# Patient Record
Sex: Male | Born: 1960 | Race: White | Hispanic: No | Marital: Single | Smoking: Former smoker
Health system: Southern US, Community
[De-identification: ages and names within clinical notes are randomized; demographics above are authoritative.]

## PROBLEM LIST (undated history)

## (undated) DIAGNOSIS — G4700 Insomnia, unspecified: Secondary | ICD-10-CM

## (undated) DIAGNOSIS — R188 Other ascites: Secondary | ICD-10-CM

## (undated) DIAGNOSIS — Z8711 Personal history of peptic ulcer disease: Secondary | ICD-10-CM

## (undated) DIAGNOSIS — F1020 Alcohol dependence, uncomplicated: Secondary | ICD-10-CM

## (undated) DIAGNOSIS — Z8719 Personal history of other diseases of the digestive system: Secondary | ICD-10-CM

## (undated) DIAGNOSIS — K7460 Unspecified cirrhosis of liver: Secondary | ICD-10-CM

## (undated) DIAGNOSIS — F419 Anxiety disorder, unspecified: Secondary | ICD-10-CM

## (undated) HISTORY — PX: HERNIA REPAIR: SHX51

## (undated) HISTORY — DX: Personal history of other diseases of the digestive system: Z87.19

## (undated) HISTORY — DX: Personal history of peptic ulcer disease: Z87.11

## (undated) HISTORY — PX: APPENDECTOMY: SHX54

## (undated) HISTORY — DX: Alcohol dependence, uncomplicated: F10.20

## (undated) HISTORY — DX: Anxiety disorder, unspecified: F41.9

---

## 2011-09-25 ENCOUNTER — Encounter (HOSPITAL_COMMUNITY): Payer: Self-pay | Admitting: *Deleted

## 2011-09-25 ENCOUNTER — Emergency Department (HOSPITAL_COMMUNITY)
Admission: EM | Admit: 2011-09-25 | Discharge: 2011-09-25 | Disposition: A | Discharge status: Home / Self Care | Payer: Medicaid Other | Attending: Emergency Medicine | Admitting: Emergency Medicine

## 2011-09-25 ENCOUNTER — Emergency Department (HOSPITAL_COMMUNITY): Payer: Medicaid Other

## 2011-09-25 DIAGNOSIS — R141 Gas pain: Secondary | ICD-10-CM | POA: Insufficient documentation

## 2011-09-25 DIAGNOSIS — K7460 Unspecified cirrhosis of liver: Secondary | ICD-10-CM

## 2011-09-25 DIAGNOSIS — Z79899 Other long term (current) drug therapy: Secondary | ICD-10-CM | POA: Insufficient documentation

## 2011-09-25 DIAGNOSIS — R143 Flatulence: Secondary | ICD-10-CM | POA: Insufficient documentation

## 2011-09-25 DIAGNOSIS — R142 Eructation: Secondary | ICD-10-CM | POA: Insufficient documentation

## 2011-09-25 DIAGNOSIS — R188 Other ascites: Secondary | ICD-10-CM | POA: Insufficient documentation

## 2011-09-25 DIAGNOSIS — R0602 Shortness of breath: Secondary | ICD-10-CM | POA: Insufficient documentation

## 2011-09-25 HISTORY — DX: Other ascites: R18.8

## 2011-09-25 HISTORY — DX: Unspecified cirrhosis of liver: K74.60

## 2011-09-25 LAB — COMPREHENSIVE METABOLIC PANEL
AST: 41 U/L — ABNORMAL HIGH (ref 0–37)
CO2: 26 mEq/L (ref 19–32)
Calcium: 8.7 mg/dL (ref 8.4–10.5)
Creatinine, Ser: 0.51 mg/dL (ref 0.50–1.35)
GFR calc Af Amer: >90 mL/min (ref >90)
GFR calc non Af Amer: >90 mL/min (ref >90)
Sodium: 133 mEq/L — ABNORMAL LOW (ref 135–145)
Total Protein: 7.6 g/dL (ref 6.0–8.3)

## 2011-09-25 LAB — CBC
HCT: 32.3 % — ABNORMAL LOW (ref 39.0–52.0)
Hemoglobin: 10.3 g/dL — ABNORMAL LOW (ref 13.0–17.0)
MCH: 25.3 pg — ABNORMAL LOW (ref 26.0–34.0)
MCHC: 31.9 g/dL (ref 30.0–36.0)
MCV: 79.4 fL (ref 78.0–100.0)
Platelets: DECREASED 10*3/uL (ref 150–400)
RBC: 4.07 MIL/uL — ABNORMAL LOW (ref 4.22–5.81)
RDW: 18.1 % — ABNORMAL HIGH (ref 11.5–15.5)
WBC: 5 10*3/uL (ref 4.0–10.5)

## 2011-09-25 LAB — COMPREHENSIVE METABOLIC PANEL WITH GFR
ALT: 22 U/L (ref 0–53)
Albumin: 2.5 g/dL — ABNORMAL LOW (ref 3.5–5.2)
Alkaline Phosphatase: 92 U/L (ref 39–117)
BUN: 11 mg/dL (ref 6–23)
Chloride: 98 meq/L (ref 96–112)
Glucose, Bld: 106 mg/dL — ABNORMAL HIGH (ref 70–99)
Potassium: 3.7 meq/L (ref 3.5–5.1)
Total Bilirubin: 0.8 mg/dL (ref 0.3–1.2)

## 2011-09-25 LAB — PROTIME-INR
INR: 1.58 — ABNORMAL HIGH (ref 0.00–1.49)
Prothrombin Time: 19.2 seconds — ABNORMAL HIGH (ref 11.6–15.2)

## 2011-09-25 LAB — APTT: aPTT: 36 s (ref 24–37)

## 2011-09-25 LAB — PLATELET COUNT: Platelets: 112 10*3/uL — ABNORMAL LOW (ref 150–400)

## 2011-09-25 MED ORDER — FUROSEMIDE 80 MG PO TABS: 80.0000 mg | ORAL_TABLET | Freq: Two times a day (BID) | ORAL | Status: DC

## 2011-09-25 MED ORDER — SPIRONOLACTONE 100 MG PO TABS: 100.0000 mg | ORAL_TABLET | Freq: Four times a day (QID) | ORAL | Status: DC

## 2011-09-25 NOTE — ED Notes (Signed)
Patient has been out of his meds for 2 mths.  He does not have a local md,  He moved here recently.  Patient has large abd swelling.  States he needs paracentesis and rx for his meds.

## 2011-09-25 NOTE — Discharge Instructions (Signed)
 Ascites Ascites is a gathering of fluid in the belly (abdomen). This is most often caused by liver disease. It may also be caused by a number of other less common problems. It causes a ballooning out (distension) of the abdomen. CAUSES  Scarring of the liver (cirrhosis) is the most common cause of ascites. Other causes include:  Infection or inflammation in the abdomen.   Cancer in the abdomen.   Heart failure.   Certain forms of kidney failure (nephritic syndrome).   Inflammation of the pancreas.   Clots in the veins of the liver.  SYMPTOMS  In the early stages of ascites, you may not have any symptoms. The main symptom of ascites is a sense of abdominal bloating. This is due to the presence of fluid. This may also cause an increase in abdominal or waist size. People with this condition can develop swelling in the legs, and men can develop a swollen scrotum. When there is a lot of fluid, it may be hard to breath. Stretching of the abdomen by fluid can be painful. DIAGNOSIS  Certain features of your medical history, such as a history of liver disease and of an enlarging abdomen, can suggest the presence of ascites. The diagnosis of ascites can be made on physical exam by your caregiver. An abdominal ultrasound examination can confirm that ascites is present, and estimate the amount of fluid. Once ascites is confirmed, it is important to determine its cause. Again, a history of one of the conditions listed in CAUSES provides a strong clue. A physical exam is important, and blood and x-ray tests may be needed. During a procedure called paracentesis, a sample of fluid is removed from the abdomen. This can determine certain key features about the fluid, such as whether or not infection or cancer is present. Your caregiver will determine if a paracentesis is necessary. They will describe the procedure to you. PREVENTION  Ascites is a complication of other conditions. Therefore to prevent ascites,  you must seek treatment for any significant health conditions you have. Once ascites is present, careful attention to fluid and salt intake may help prevent it from getting worse. If you have ascites, you should not drink alcohol. PROGNOSIS  The prognosis of ascites depends on the underlying disease. If the disease is reversible, such as with certain infections or with heart failure, then ascites may improve or disappear. When ascites is caused by cirrhosis, then it indicates that the liver disease has worsened, and further evaluation and treatment of the liver disease is needed. If your ascites is caused by cancer, then the success or failure of the cancer treatment will determine whether your ascites will improve or worsen. RISKS AND COMPLICATIONS  Ascites is likely to worsen if it is not properly diagnosed and treated. A large amount of ascites can cause pain and difficulty breathing. The main complication, besides worsening, is infection (called spontaneous bacterial peritonitis). This requires prompt treatment. TREATMENT  The treatment of ascites depends on its cause. When liver disease is your cause, medical management using water pills (diuretics) and decreasing salt intake is often effective. Ascites due to peritoneal inflammation or malignancy (cancer) alone does not respond to salt restriction and diuretics. Hospitalization is sometimes required. If the treatment of ascites cannot be managed with medications, a number of other treatments are available. Your caregivers will help you decide which will work best for you. Some of these are:  Removal of fluid from the abdomen (paracentesis).   Fluid from the abdomen  is passed into a vein (peritoneo-venous shunting).   Liver transplantation.   Transjugular intrahepatic portosystemic stent shunt.  HOME CARE INSTRUCTIONS  It is important to monitor body weight and the intake and output of fluids. Weigh yourself at the same time every day. Record  your weights. Fluid restriction may be necessary. It is also important to know your salt intake. The more salt you take in, the more fluid you will retain. Ninety percent of people with ascites respond to this approach.  Follow any directions for medications carefully.   Follow-up with your caregiver, as directed.   Be sure to report any changes in your health, especially any new or worsening symptoms.   If your ascites is from liver disease, you should avoid alcohol and other substances toxic to the liver.  SEEK MEDICAL CARE IF:   Your weight increases more than a few pounds in a few days.   You abdominal or waist size increases.   You develop swelling in your legs.   If you had swelling and it worsens.  SEEK IMMEDIATE MEDICAL CARE IF:   You develop a fever.   You develop new abdominal pain.   You develop difficulty breathing.   You develop confusion.   You have bleeding from the mouth, stomach, or rectum.  MAKE SURE YOU:   Understand these instructions.   Will watch your condition.   Will get help right away if you are not doing well or get worse.  Document Released: 08/13/2005 Document Revised: 04/25/2011 Document Reviewed: 03/14/2007 [facility] Patient Information 2012 [place], [place].     Please start your medications taking the furosemide  once daily for 3 days before going up to twice daily.  And the spironolactone  once daily for 3 days, then twice daily for 3 days and after that, finally go up to 4 times daily.  This is to ensure that your body becomes accustomed to the fluid changes.

## 2011-09-25 NOTE — ED Provider Notes (Signed)
51 year old gentleman with history of cirrhosis the liver who recently undergoing a paracentesis. He is currently being monitored in CDU to assure his vital sign will be at baseline prior to discharge.  6:38 PM Patient appears to be in no acute distress after paracentesis. Dr. Oletta Lamas has re-evaluated the patient and will be discharging him.  Fayrene Helper, PA-C 09/25/11 1839

## 2011-09-25 NOTE — ED Provider Notes (Signed)
Medical screening examination/treatment/procedure(s) were conducted as a shared visit with non-physician practitioner(s) and myself.  I personally evaluated the patient during the encounter Please see my full H&P. Pt is ambulating, reports feeling much improved, vitals are stable.  Referrals provided as well as outpt prescriptions  Gavin Pound. Aliyanna Wassmer, MD 09/25/11 1958

## 2011-09-25 NOTE — ED Notes (Signed)
Pt transported to have US paracentesis. Pt to be moved to CDU 4, then to be discharged home after procedure.

## 2011-09-25 NOTE — ED Notes (Signed)
Pt presents to department for evaluation of abdominal swelling and discomfort. History of cirrhosis. Pt states he ran out of diuretic medication several weeks ago. Abdomen hard and distended. Tender to palpation. Bowel sounds present all quadrants. No nausea/vomiting. 5/10 pain at the time. Ambulatory without difficulty. Pt is alert and oriented x4. No signs of distress at the time.

## 2011-09-25 NOTE — ED Provider Notes (Signed)
History     CSN: [id number]  Arrival date & time 09/25/11  1058   First MD Initiated Contact with Patient 09/25/11 1437      Chief Complaint  Patient presents with  . Edema  . 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 He used to live in [place] but moved here to [place] 6 days ago. He reports that his sister lives here in this town therefore he would be closer to family. The patient does not have insurance and is in the process of trying to establish with Medicaid. He [facility] not [facility]. He denies any other health problems. He reports that he was taking 160 mg of furosemide as well as 400 mg of spironolactone daily. He is been out of his diuretic medication for the last 2 months since he lost his insurance and his primary care physician would no longer see him or refill his medications. The patient did have hernia surgery as well as paracentesis about 6 months ago. This was the last time he had a surgical drainage. He reports it being on the diuretics kept him from developing significant swelling. He reports currently his abdomen is very swollen and he feels like approximately 10 L of fluid might be able to be drained. Patient has felt the swollen in the past. He reports that he is short of breath because he simply cannot take a deep breath. He denies any chest pain, fever, congestion, cough. He reports that he has never been told of any platelet or coagulopathies in the past. He is here essentially asking if he can have a paracentesis procedure and whether or not I would be willing to give him refills of his medications until his Medicaid might be able to kick in and he can establish with a primary care physician.  Patient  is a 51 y.o. male presenting with shortness of breath. The history is provided by the patient.  Shortness of Breath  Associated symptoms include shortness of breath. Pertinent negatives include no chest pain and no fever.    Past Medical History  Diagnosis Date  . Cirrhosis of liver   . Ascitic fluid     Past Surgical History  Procedure Date  . Hernia repair   . Appendectomy     No family history on file.  History  Substance Use Topics  . Smoking status: Current Everyday Smoker  . Smokeless tobacco: Not on file  . Alcohol Use: No      Review of Systems  Constitutional: Negative for fever and chills.  Respiratory: Positive for shortness of breath.   Cardiovascular: Negative for chest pain.  Gastrointestinal: Positive for abdominal distention. Negative for nausea, vomiting and diarrhea.  Hematological: Does not bruise/bleed easily.  Psychiatric/Behavioral: Negative for confusion.  All other systems reviewed and are negative.    Allergies  Review of patient's allergies indicates no known allergies.  Home Medications   Current Outpatient Rx  Name Route Sig Dispense Refill  . FUROSEMIDE PO Oral Take 1 tablet by mouth daily.    . FUROSEMIDE 80 MG PO TABS Oral Take 1 tablet (80 mg total) by mouth 2 (two) times daily. 60 tablet 0  . SPIRONOLACTONE 100 MG PO TABS Oral Take 1 tablet (100 mg total) by mouth 4 (four) times daily. 120  tablet 0    BP 112/74  Pulse 98  Temp(Src) 98 F (36.7 C) (Oral)  Resp 18  Ht 5\' 9"  (1.753 m)  Wt 185 lb (83.915 kg)  BMI 27.32 kg/m2  SpO2 100%  Physical Exam  Nursing note and vitals reviewed. Constitutional: He is oriented to person, place, and time. He appears well-developed.  HENT:  Head: Normocephalic.  Eyes: Pupils are equal, round, and reactive to light.  Cardiovascular: Normal rate.   Pulmonary/Chest: Effort normal. He has no wheezes. He has no rales.  Abdominal: He exhibits distension. There is no rebound and no  guarding.  Neurological: He is alert and oriented to person, place, and time.  Skin: Skin is warm. No rash noted.  Psychiatric: He has a normal mood and affect.    ED Course  Procedures (including critical care time)  Labs Reviewed  CBC - Abnormal; Notable for the following:    RBC 4.07 (*)    Hemoglobin 10.3 (*)    HCT 32.3 (*)    MCH 25.3 (*)    RDW 18.1 (*)    All other components within normal limits  COMPREHENSIVE METABOLIC PANEL - Abnormal; Notable for the following:    Sodium 133 (*)    Glucose, Bld 106 (*)    Albumin 2.5 (*)    AST 41 (*)    All other components within normal limits  PROTIME-INR - Abnormal; Notable for the following:    Prothrombin Time 19.2 (*)    INR 1.58 (*)    All other components within normal limits  APTT  PLATELET COUNT   No results found.   1. Ascites   2. Cirrhosis       MDM  Patient has a extremely protuberant and distended abdomen with noticeable spider veins. This is likely secondary to cirrhosis and significant ascites. My plan is to obtain coags and platelet count and consider paracentesis with interventional radiology. I have discussed the need for establishing with a primary care clinic such as [facility] even though Medicaid has not yet taken. I do not mind giving him a prescription for furosemide spironolactone, however will need some baseline blood test to begin with. Also he is asking for 3 months of medication which I am not comfortable giving. He may have to return to the emergency department for recheck if he is not able to establish with [facility] in the meantime. His room air saturations are 100% which is normal. Patient does not have any chest pain in his lung sounds are clear. I suspect his ascites is the reason for his subjective dyspnea.   3:54 PM  I spoke into the interventional radiologist who will perform ultrasound-guided paracentesis. I have written for one month supply of both furosemide and  spironolactone for the patient at his usual dose. However I advised him to go slowly and to take half doses for about 3 days before moving on to his original home dose since he has not been on his medications for approximately 2 months. I have also given him a referral to [facility] for him to establish with a primary care physician.         Gavin Pound. Briar Witherspoon, MD 09/25/11 1555

## 2011-09-25 NOTE — ED Notes (Signed)
Pt returned from ultrasound. Denies complaints. To be d/c'd.

## 2011-09-27 ENCOUNTER — Telehealth (HOSPITAL_COMMUNITY): Payer: Self-pay

## 2011-10-17 ENCOUNTER — Encounter (HOSPITAL_COMMUNITY): Payer: Self-pay | Admitting: *Deleted

## 2011-10-17 ENCOUNTER — Emergency Department (HOSPITAL_COMMUNITY)
Admission: EM | Admit: 2011-10-17 | Discharge: 2011-10-17 | Disposition: A | Discharge status: Home Health | Payer: Medicaid Other | Attending: Emergency Medicine | Admitting: Emergency Medicine

## 2011-10-17 DIAGNOSIS — Z79899 Other long term (current) drug therapy: Secondary | ICD-10-CM | POA: Insufficient documentation

## 2011-10-17 DIAGNOSIS — K7460 Unspecified cirrhosis of liver: Secondary | ICD-10-CM | POA: Insufficient documentation

## 2011-10-17 DIAGNOSIS — E871 Hypo-osmolality and hyponatremia: Secondary | ICD-10-CM | POA: Insufficient documentation

## 2011-10-17 LAB — PROTIME-INR: Prothrombin Time: 17.2 seconds — ABNORMAL HIGH (ref 11.6–15.2)

## 2011-10-17 LAB — BASIC METABOLIC PANEL
BUN: 21 mg/dL (ref 6–23)
Creatinine, Ser: 0.73 mg/dL (ref 0.50–1.35)
GFR calc Af Amer: >90 mL/min (ref >90)
GFR calc non Af Amer: >90 mL/min (ref >90)

## 2011-10-17 LAB — CBC
MCH: 25.2 pg — ABNORMAL LOW (ref 26.0–34.0)
MCHC: 33.2 g/dL (ref 30.0–36.0)
MCV: 76 fL — ABNORMAL LOW (ref 78.0–100.0)
Platelets: 85 10*3/uL — ABNORMAL LOW (ref 150–400)
RDW: 18.9 % — ABNORMAL HIGH (ref 11.5–15.5)

## 2011-10-17 MED ORDER — FUROSEMIDE 80 MG PO TABS: 80.0000 mg | ORAL_TABLET | Freq: Two times a day (BID) | ORAL | Status: DC

## 2011-10-17 MED ORDER — POTASSIUM CHLORIDE CRYS ER 20 MEQ PO TBCR: 20.0000 meq | EXTENDED_RELEASE_TABLET | Freq: Two times a day (BID) | ORAL | Status: DC

## 2011-10-17 MED ORDER — SPIRONOLACTONE 100 MG PO TABS: 100.0000 mg | ORAL_TABLET | Freq: Four times a day (QID) | ORAL | Status: DC

## 2011-10-17 MED ORDER — SODIUM CHLORIDE 0.9 % IV SOLN: Freq: Once | INTRAVENOUS | Status: DC

## 2011-10-17 NOTE — ED Notes (Signed)
Patient left ambulatory. Verbalized understanding that follow up required for refill of prescriptions.

## 2011-10-17 NOTE — ED Provider Notes (Signed)
I saw and evaluated the patient, reviewed the resident's note and I agree with the findings and plan. 51 year old, male, with cirrhosis, presents to the emergency department requesting a refill of his medications.  He has no pain, nausea, vomiting, fevers, chills, or shortness of breath.  He is waiting to get enrolled in [place], so, he can see a gastroenterologist.  He notices dosages of both parietal act and Lasix, which we will refill.  Since both are diuretics.  We will do a chemistry test, but no other tests are indicated.  His physical examination is completely benign.  Nicholes Stairs, MD 10/17/11 [phone]

## 2011-10-17 NOTE — Discharge Instructions (Signed)
Hyponatremia  Hyponatremia is when the amount of salt (sodium) in your blood is too low. When sodium levels are low, your cells will absorb extra water and swell. The swelling happens throughout the body, but it mostly affects the brain. Severe brain swelling (cerebral edema), seizures, or coma can happen.  CAUSES   Heart, kidney, or liver problems.   Thyroid problems.   Adrenal gland problems.   Severe vomiting and diarrhea.   Certain medicines or illegal drugs.   Dehydration.   Drinking too much water.   Low-sodium diet.  SYMPTOMS   Nausea and vomiting.   Confusion.   Lethargy.   Agitation.   Headache.   Twitching or shaking (seizures).   Unconsciousness.   Appetite loss.   Muscle weakness and cramping.  DIAGNOSIS  Hyponatremia is identified by a simple blood test. Your caregiver will perform a history and physical exam to try to find the cause and type of hyponatremia. Other tests may be needed to measure the amount of sodium in your blood and urine. TREATMENT  Treatment will depend on the cause.   Fluids may be given through the vein (IV).   Medicines may be used to correct the sodium imbalance. If medicines are causing the problem, they will need to be adjusted.   Water or fluid intake may be restricted to restore proper balance.  The speed of correcting the sodium problem is very important. If the problem is corrected too fast, nerve damage (sometimes unchangeable) can happen. HOME CARE INSTRUCTIONS   Only take medicines as directed by your caregiver. Many medicines can make hyponatremia worse. Discuss all your medicines with your caregiver.   Carefully follow any recommended diet, including any fluid restrictions.   You may be asked to repeat lab tests. Follow these directions.   Avoid alcohol and recreational drugs.  SEEK MEDICAL CARE IF:   You develop worsening nausea, fatigue, headache, confusion, or weakness.   Your original hyponatremia  symptoms return.   You have problems following the recommended diet.  SEEK IMMEDIATE MEDICAL CARE IF:   You have a seizure.   You faint.   You have ongoing diarrhea or vomiting.  MAKE SURE YOU:   Understand these instructions.   Will watch your condition.   Will get help right away if you are not doing well or get worse.  Document Released: 08/03/2002 Document Revised: 04/25/2011 Document Reviewed: 01/28/2011 [employer] Patient Information 2012 [employer]. 

## 2011-10-17 NOTE — ED Provider Notes (Signed)
History     CSN: [id number]  Arrival date & time 10/17/11  0724   First MD Initiated Contact with Patient 10/17/11 0732      Chief Complaint  Patient presents with  . Medication Refill    HPI History provided by the patient.  States he has been doing well since his ED visit on 09/25/11 when he had a large volume paracentesis and was prescribed a month's worth of Lasix and spronolactone.  He states he is waiting on Medicaid, but does not know how long that will take.  He says he needs refills on his medications or his belly will swell up again.   He denies any abdominal pain, fevers, shortness of breath.   Past Medical History  Diagnosis Date  . Cirrhosis of liver   . Ascitic fluid     Past Surgical History  Procedure Date  . Hernia repair   . Appendectomy     History reviewed. No pertinent family history.  History  Substance Use Topics  . Smoking status: Current Everyday Smoker -- 0.5 packs/day  . Smokeless tobacco: Not on file  . Alcohol Use: No      Review of Systems  Constitutional: Negative for fever.  HENT: Negative for rhinorrhea.   Eyes: Negative for visual disturbance.  Respiratory: Negative for shortness of breath.   Cardiovascular: Negative for chest pain.  Gastrointestinal: Negative for abdominal pain and abdominal distention.  Genitourinary: Negative for dysuria.  Musculoskeletal: Negative for back pain.  Skin: Negative for rash.  Neurological: Negative for weakness.  Hematological: Does not bruise/bleed easily.    Allergies  Review of patient's allergies indicates no known allergies.  Home Medications   Current Outpatient Rx  Name Route Sig Dispense Refill  . FUROSEMIDE 80 MG PO TABS Oral Take 1 tablet (80 mg total) by mouth 2 (two) times daily. 60 tablet 0  . OMEPRAZOLE MAGNESIUM 20.6 (20 BASE) MG PO CPDR Oral Take 1 capsule by mouth daily.    Marland Kitchen POTASSIUM CHLORIDE 20 MEQ PO PACK Oral Take 20 mEq by mouth 2 (two) times daily.    Marland Kitchen  SPIRONOLACTONE 100 MG PO TABS Oral Take 1 tablet (100 mg total) by mouth 4 (four) times daily. 120 tablet 0    BP 105/61  Pulse 98  Temp(Src) 98.2 F (36.8 C) (Oral)  Resp 16  SpO2 100%  Physical Exam  Constitutional: He is oriented to person, place, and time. No distress.  HENT:  Head: Normocephalic and atraumatic.  Mouth/Throat: Oropharynx is clear and moist.  Eyes: EOM are normal. Pupils are equal, round, and reactive to light.  Neck: Normal range of motion. No JVD present.  Cardiovascular: Normal rate, regular rhythm, normal heart sounds and intact distal pulses.   Pulmonary/Chest: Effort normal and breath sounds normal. He has no rales.  Abdominal:       Soft, + BS, no tenderness, with slight ascites.   Musculoskeletal: He exhibits no edema.  Neurological: He is alert and oriented to person, place, and time.  Skin: Skin is warm and dry. No rash noted.    ED Course  Procedures (including critical care time)  Labs Reviewed  BASIC METABOLIC PANEL - Abnormal; Notable for the following:    Sodium 125 (*)    Chloride 94 (*)    Glucose, Bld 107 (*)    All other components within normal limits  PROTIME-INR - Abnormal; Notable for the following:    Prothrombin Time 17.2 (*)    All other  components within normal limits  CBC   No results found.   1. Cirrhosis   2. Hyponatremia     MDM  Patient advised he should be admitted due to hyponatremia and risk for seizures and death.  Patient refuses admission and states he has a 51 year old child to take care of, cannot be admitted.  He voices understanding about his condition.  He states he will increase his salt intake.  Prescriptions refilled and contact information about [facility] given, pt advised to call for an appointment.         Ardyth Gal, MD 10/18/11 1432

## 2011-10-17 NOTE — ED Notes (Signed)
Patient came in due to needing a prescription refill and wants his potassium checked.  Patient does not have a MD and is awaiting insurance.  Had ascites last month with paracentesis on 09/25/11

## 2011-10-18 NOTE — ED Provider Notes (Signed)
I saw and evaluated the patient, reviewed the resident's note and I agree with the findings and plan.  Nicholes Stairs, MD 10/18/11 [phone]

## 2011-11-26 ENCOUNTER — Inpatient Hospital Stay (HOSPITAL_COMMUNITY)
Admission: EM | Admit: 2011-11-26 | Discharge: 2011-11-27 | DRG: 378 | Discharge status: Against Medical Advice | Payer: Medicaid Other | Attending: Pulmonary Disease | Admitting: Pulmonary Disease

## 2011-11-26 ENCOUNTER — Emergency Department (HOSPITAL_COMMUNITY): Payer: Medicaid Other

## 2011-11-26 ENCOUNTER — Encounter (HOSPITAL_COMMUNITY): Payer: Self-pay

## 2011-11-26 DIAGNOSIS — D62 Acute posthemorrhagic anemia: Secondary | ICD-10-CM | POA: Diagnosis present

## 2011-11-26 DIAGNOSIS — R0902 Hypoxemia: Secondary | ICD-10-CM | POA: Diagnosis present

## 2011-11-26 DIAGNOSIS — K7030 Alcoholic cirrhosis of liver without ascites: Secondary | ICD-10-CM | POA: Diagnosis present

## 2011-11-26 DIAGNOSIS — R188 Other ascites: Secondary | ICD-10-CM | POA: Diagnosis present

## 2011-11-26 DIAGNOSIS — R579 Shock, unspecified: Secondary | ICD-10-CM

## 2011-11-26 DIAGNOSIS — K769 Liver disease, unspecified: Secondary | ICD-10-CM | POA: Diagnosis present

## 2011-11-26 DIAGNOSIS — K7290 Hepatic failure, unspecified without coma: Secondary | ICD-10-CM

## 2011-11-26 DIAGNOSIS — F17200 Nicotine dependence, unspecified, uncomplicated: Secondary | ICD-10-CM | POA: Diagnosis present

## 2011-11-26 DIAGNOSIS — D689 Coagulation defect, unspecified: Secondary | ICD-10-CM | POA: Diagnosis present

## 2011-11-26 DIAGNOSIS — F1020 Alcohol dependence, uncomplicated: Secondary | ICD-10-CM | POA: Diagnosis present

## 2011-11-26 DIAGNOSIS — J811 Chronic pulmonary edema: Secondary | ICD-10-CM | POA: Diagnosis present

## 2011-11-26 DIAGNOSIS — K920 Hematemesis: Principal | ICD-10-CM | POA: Diagnosis present

## 2011-11-26 LAB — COMPREHENSIVE METABOLIC PANEL
AST: 34 U/L (ref 0–37)
Albumin: 1.6 g/dL — ABNORMAL LOW (ref 3.5–5.2)
CO2: 15 mEq/L — ABNORMAL LOW (ref 19–32)
Calcium: 6.8 mg/dL — ABNORMAL LOW (ref 8.4–10.5)
Creatinine, Ser: 0.72 mg/dL (ref 0.50–1.35)
GFR calc non Af Amer: >90 mL/min (ref >90)
Total Protein: 3.5 g/dL — ABNORMAL LOW (ref 6.0–8.3)

## 2011-11-26 LAB — TYPE AND SCREEN
ABO/RH(D): O POS
Antibody Screen: POSITIVE

## 2011-11-26 LAB — BASIC METABOLIC PANEL
GFR calc Af Amer: >90 mL/min (ref >90)
GFR calc non Af Amer: >90 mL/min (ref >90)
Glucose, Bld: 119 mg/dL — ABNORMAL HIGH (ref 70–99)
Potassium: 5.3 mEq/L — ABNORMAL HIGH (ref 3.5–5.1)
Sodium: 127 mEq/L — ABNORMAL LOW (ref 135–145)

## 2011-11-26 LAB — SALICYLATE LEVEL: Salicylate Lvl: <2 mg/dL — ABNORMAL LOW (ref 2.8–20.0)

## 2011-11-26 LAB — URINALYSIS, ROUTINE W REFLEX MICROSCOPIC
Bilirubin Urine: NEGATIVE
Leukocytes, UA: NEGATIVE
Nitrite: NEGATIVE
Specific Gravity, Urine: 1.017 (ref 1.005–1.030)
Urobilinogen, UA: 0.2 mg/dL (ref 0.0–1.0)

## 2011-11-26 LAB — CBC
Hemoglobin: 6.1 g/dL — CL (ref 13.0–17.0)
MCH: 22.9 pg — ABNORMAL LOW (ref 26.0–34.0)
MCH: 26.2 pg (ref 26.0–34.0)
MCHC: 30.8 g/dL (ref 30.0–36.0)
MCHC: 33.7 g/dL (ref 30.0–36.0)
MCV: 74.3 fL — ABNORMAL LOW (ref 78.0–100.0)
MCV: 77.7 fL — ABNORMAL LOW (ref 78.0–100.0)
Platelets: 94 10*3/uL — ABNORMAL LOW (ref 150–400)
RBC: 2.33 MIL/uL — ABNORMAL LOW (ref 4.22–5.81)
RDW: 18.2 % — ABNORMAL HIGH (ref 11.5–15.5)

## 2011-11-26 LAB — DIFFERENTIAL
Basophils Relative: 0 % (ref 0–1)
Eosinophils Relative: 1 % (ref 0–5)
Lymphs Abs: 2.2 10*3/uL (ref 0.7–4.0)
Monocytes Relative: 2 % — ABNORMAL LOW (ref 3–12)

## 2011-11-26 LAB — PREPARE RBC (CROSSMATCH)

## 2011-11-26 LAB — PROCALCITONIN: Procalcitonin: 0.19 ng/mL

## 2011-11-26 LAB — PROTIME-INR: Prothrombin Time: 28.9 seconds — ABNORMAL HIGH (ref 11.6–15.2)

## 2011-11-26 LAB — GLUCOSE, CAPILLARY: Glucose-Capillary: 107 mg/dL — ABNORMAL HIGH (ref 70–99)

## 2011-11-26 LAB — RAPID URINE DRUG SCREEN, HOSP PERFORMED
Amphetamines: NOT DETECTED
Opiates: NOT DETECTED

## 2011-11-26 LAB — LACTIC ACID, PLASMA: Lactic Acid, Venous: 4.9 mmol/L — ABNORMAL HIGH (ref 0.5–2.2)

## 2011-11-26 LAB — OCCULT BLOOD X 1 CARD TO LAB, STOOL: Fecal Occult Bld: NEGATIVE

## 2011-11-26 MED ORDER — SODIUM CHLORIDE 0.9 % IV SOLN
INTRAVENOUS | Status: DC
  Administered 2011-11-27: 05:00:00 via INTRAVENOUS

## 2011-11-26 MED ORDER — ACETAMINOPHEN 650 MG RE SUPP: 650.0000 mg | Freq: Four times a day (QID) | RECTAL | Status: DC | PRN

## 2011-11-26 MED ORDER — SODIUM CHLORIDE 0.9 % IV SOLN
8.0000 mg/h | INTRAVENOUS | Status: DC
  Administered 2011-11-26 – 2011-11-27 (×3): 8 mg/h via INTRAVENOUS
  Filled 2011-11-26 (×8): qty 80

## 2011-11-26 MED ORDER — ONDANSETRON HCL 4 MG/2ML IJ SOLN: 4.0000 mg | Freq: Four times a day (QID) | INTRAMUSCULAR | Status: DC | PRN

## 2011-11-26 MED ORDER — ACETAMINOPHEN 325 MG PO TABS: 650.0000 mg | ORAL_TABLET | Freq: Four times a day (QID) | ORAL | Status: DC | PRN

## 2011-11-26 MED ORDER — ONDANSETRON HCL 4 MG PO TABS: 4.0000 mg | ORAL_TABLET | Freq: Four times a day (QID) | ORAL | Status: DC | PRN

## 2011-11-26 MED ORDER — MORPHINE SULFATE 2 MG/ML IJ SOLN: 2.0000 mg | INTRAMUSCULAR | Status: DC | PRN

## 2011-11-26 MED ORDER — ONDANSETRON HCL 4 MG/2ML IJ SOLN
4.0000 mg | Freq: Once | INTRAMUSCULAR | Status: AC
  Administered 2011-11-26: 4 mg via INTRAVENOUS
  Filled 2011-11-26: qty 2

## 2011-11-26 MED ORDER — HYDROCODONE-ACETAMINOPHEN 5-325 MG PO TABS: 1.0000 | ORAL_TABLET | ORAL | Status: DC | PRN

## 2011-11-26 MED ORDER — SODIUM CHLORIDE 0.9 % IV SOLN
Freq: Once | INTRAVENOUS | Status: AC
  Administered 2011-11-26: 999 mL via INTRAVENOUS

## 2011-11-26 MED ORDER — SODIUM CHLORIDE 0.9 % IV SOLN
80.0000 mg | Freq: Once | INTRAVENOUS | Status: AC
  Administered 2011-11-26: 80 mg via INTRAVENOUS
  Filled 2011-11-26: qty 80

## 2011-11-26 MED ORDER — SODIUM CHLORIDE 0.9 % IV SOLN
INTRAVENOUS | Status: DC
  Administered 2011-11-26: 13:00:00 via INTRAVENOUS

## 2011-11-26 MED ORDER — SODIUM CHLORIDE 0.9 % IV SOLN
50.0000 ug/h | INTRAVENOUS | Status: DC
  Administered 2011-11-26 (×2): 50 ug/h via INTRAVENOUS
  Filled 2011-11-26 (×7): qty 1

## 2011-11-26 NOTE — ED Notes (Signed)
Rn on floor stss he will call back

## 2011-11-26 NOTE — Progress Notes (Signed)
The patient has had a low blood pressure and the MDs are aware. The patient took off his blood pressure cuff and said his arm needed a break. I educated him on the importance of monitoring his blood pressure because it is low, and he said "my arm needs a break, I want it off for a little bit."  Milinda Cave, RN

## 2011-11-26 NOTE — ED Notes (Signed)
MD at bedside. 

## 2011-11-26 NOTE — Progress Notes (Signed)
CRITICAL VALUE ALERT  Critical value received:  Hgb 6.1  CKMB 6.9  Date of notification:  11/26/11  Time of notification:  2200  Critical value read back:yes  Nurse who received alert:  A.Canary Brim RN  MD notified (1st page):  Clyde Lundborg MD  Time of first page:  2200  MD notified (2nd page):  Time of second page:  Responding MD:  Clyde Lundborg MD  Time MD responded:  2200

## 2011-11-26 NOTE — ED Notes (Signed)
Xray with patient, necklace silver with cross was removed for xray and placed back on patient by xray, this witnessed by this RN.

## 2011-11-26 NOTE — ED Provider Notes (Signed)
History     CSN: [id number]  Arrival date & time 11/26/11  0902   First MD Initiated Contact with Patient 11/26/11 [phone]      Chief Complaint  Patient presents with  . GI Bleeding     HPI The patient presents with concerns over hematemesis, diarrhea, nausea.  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  He notes no alcohol use in almost one year.  He has recently moved from [place] and has no primary care physician here.  He has been taking spironolactone and Lasix.  He notes that 3 days ago he stopped taking his Lasix due to discomfort.  Since that time he has had increasing abdominal girth and fatigue.  He notes no notable changes until last night.  Approximately 10 hours ago the patient had a late snack of a tuna.  He soon thereafter developed nausea and experienced multiple episodes of emesis and diarrhea.  He notes that the emesis turned into hematemesis.  Due to the persistency of this he called EMS.  He denies any lightheadedness, syncope, chest pain, dyspnea, confusion.  He notes that he has had similar episodes in the past, with more profound bleeding. The patient has had no recent endoscopy Past Medical History  Diagnosis Date  . Cirrhosis of liver   . Ascitic fluid     Past Surgical History  Procedure Date  . Hernia repair   . Appendectomy     History reviewed. No pertinent family history.  History  Substance Use Topics  . Smoking status: Current Everyday Smoker -- 0.5 packs/day  . Smokeless tobacco: Not on file  . Alcohol Use: No      Review of Systems  Constitutional:       Per HPI, otherwise negative  HENT:       Per HPI, otherwise negative  Eyes: Negative.   Respiratory:       Per HPI, otherwise negative  Cardiovascular:       Per HPI, otherwise negative  Gastrointestinal: Positive for nausea, vomiting, blood in stool and abdominal distention. Negative for abdominal pain and rectal pain.  Genitourinary: Negative.   Musculoskeletal:        Per HPI, otherwise negative  Skin: Negative.   Neurological: Negative for syncope.    Allergies  Review of patient's allergies indicates no known allergies.  Home Medications   Current Outpatient Rx  Name Route Sig Dispense Refill  . FUROSEMIDE 80 MG PO TABS Oral Take 1 tablet (80 mg total) by mouth 2 (two) times daily. 60 tablet 0  . FUROSEMIDE 80 MG PO TABS Oral Take 1 tablet (80 mg total) by mouth 2 (two) times daily. 30 tablet 2  . OMEPRAZOLE MAGNESIUM 20.6 (20 BASE) MG PO CPDR Oral Take 1 capsule by mouth daily.    Marland Kitchen POTASSIUM CHLORIDE 20 MEQ PO PACK Oral Take 20 mEq by mouth 2 (two) times daily.    Marland Kitchen POTASSIUM CHLORIDE CRYS ER 20 MEQ PO TBCR Oral Take 1 tablet (20 mEq total) by mouth 2 (two) times daily. 30 tablet 2  . SPIRONOLACTONE 100 MG PO TABS Oral Take 1 tablet (100 mg total) by mouth 4 (four) times daily. 120 tablet 0  . SPIRONOLACTONE 100 MG PO TABS Oral Take 1 tablet (100 mg total) by mouth 4 (four) times daily. 120 tablet 2    BP 67/37  Pulse 99  Temp(Src) 95.5 F (35.3 C) (Rectal)  Resp 18  Ht 5\' 9"  (1.753 m)  Wt 160 lb (72.576 kg)  BMI 23.63 kg/m2  SpO2 93%  Physical Exam  Nursing note and vitals reviewed. Constitutional: He is active. He has a sickly appearance.  HENT:  Head: Normocephalic and atraumatic.  Eyes: Conjunctivae and EOM are normal.  Cardiovascular: Normal rate and regular rhythm.   Pulmonary/Chest: Effort normal. No stridor. No respiratory distress.  Abdominal: He exhibits distension. He exhibits no mass. There is tenderness. There is no rebound and no guarding.  Musculoskeletal: He exhibits edema. He exhibits no tenderness.  Neurological: He is alert. No cranial nerve deficit. He exhibits normal muscle tone.  Skin: Skin is warm and dry.  Psychiatric: He has a normal mood and affect.   9:24 AM With patient's initial blood pressure of 70/50 he received an additional normal saline bolus. ED Course  Procedures (including critical  care time)   Labs Reviewed  PROTIME-INR  CBC  COMPREHENSIVE METABOLIC PANEL  TYPE AND SCREEN   No results found.   No diagnosis found.  Pulse ox 100% ra- normal  Cardiac 101 - st abnormal  11:34 AM Patient w no additional emesis.  He remains tachycardiac after 2l of ns.  Blood transfusion ordered.  MDM  This 51yo M w Hx of cirrhosis now p/w several days of fatigue and new hematemeses / brbpr.  On initial exam the patient is awake, alert, but w hypotension (SBP ~70) and tachycardia concerning for ongoing blood loss.  The patient was also guaiac positive.  Given these concerns the patient was empirically provided pantoprazole and octreotide and given multiple boluses of NS.  His BP improved mildly.  His labs were notable for decreased liver function and anemia (Hgb ~3).  He was transfused and the PCCM team was consulted for admission.  The patient was admitted to their service.  CRITICAL CARE Performed by: Gerhard Munch   Total critical care time: 35  Critical care time was exclusive of separately billable procedures and treating other patients.  Critical care was necessary to treat or prevent imminent or life-threatening deterioration.  Critical care was time spent personally by me on the following activities: development of treatment plan with patient and/or surrogate as well as nursing, discussions with consultants, evaluation of patient's response to treatment, examination of patient, obtaining history from patient or surrogate, ordering and performing treatments and interventions, ordering and review of laboratory studies, ordering and review of radiographic studies, pulse oximetry and re-evaluation of patient's condition.         Gerhard Munch, MD 11/26/11 1137

## 2011-11-26 NOTE — ED Notes (Signed)
hgb reported of 3.3 and dr lockwood aware. Pharmacy notified of need for medications.

## 2011-11-26 NOTE — ED Notes (Signed)
Pt presents with one day hx of gi bleed, hx of esophageal varices. Took nsaid last pm for back pain, reports changing dose of medications (fluid pills) over last several days as well. Pt very aggressive and agitated with staff on arrival, resp e/u and nad ntoed, pt on nrb on arrival. Pt with diarrhea per ems staff. And feces dried to feet. Stool is Samaria Anes in color. Pt with hx of etoh abuse but denies use in the las 9 months. Pt also hypotensive on arrival

## 2011-11-26 NOTE — Progress Notes (Addendum)
Patient repeatedly takes his heart monitor off, after being educated on the importance of it. Patient has also, been educated on the reason he is NPO. Patient is very derogatory towards nursing staff. Patient states we are "killing him of thirst." Discussed with MD Zubelevitskiy about giving the patient lots of ice chips (which is what he is requesting). Due to upper GI bleed MD does not want patient to have ice chips, orders remain to keep patient NPO. Patient has been offered swabs for mouth. Charge Nurse has also talked with patient.  [name]

## 2011-11-26 NOTE — Consult Note (Signed)
Reason for Consult: UGI bleeding, Melena with Hb-3.3 Referring Physician: Dr. Koren Bound  Clayton Watson is an 51 y.o. male.   HPI: Patient is a 51 year old male with a past medical history of alcoholic cirrhosis who recently moved to [place] from [place] to live with his mother is admitted to ICU due to Acute UGI bleeding and melena with Hb 3.3.  Pt reports having only 1 bloody vomitus this morning and a dark BM since last night- but reportedly-from his admission notes he has fatigue and dark stools for the past week.  H reports taking Naproxen for past few days for his back pain and says that's what triggered his bleeding- as it was also the case in past. He also reports taking Spironolactone 400mg  daily and Lasix 160 mg daily for his ascites. He had upper GI bleeding diagnosed with varices/esophageal ulceration- back in [place] 3 to 4 years before. He denies any fever or chills, has no  Abdominal pain, CP or SOB.  He is frustrated over not getting blood transfusions yet.   Past Medical History  Diagnosis Date  . Cirrhosis of liver   . Ascitic fluid     Past Surgical History  Procedure Date  . Hernia repair   . Appendectomy     History reviewed. No pertinent family history.  Social History:  reports that he has been smoking.  He does not have any smokeless tobacco history on file. He reports that he does not drink alcohol or use illicit drugs.  Allergies: No Known Allergies  Medications: I have reviewed the patient's current medications.  Results for orders placed during the hospital encounter of 11/26/11 (from the past 48 hour(s))  TYPE AND SCREEN     Status: Normal (Preliminary result)   Collection Time   11/26/11  9:30 AM      Component Value Range Comment   ABO/RH(D) O POS      Antibody Screen PENDING      Sample Expiration 11/29/2011     PROTIME-INR     Status: Abnormal   Collection Time   11/26/11  9:42 AM      Component Value Range Comment   Prothrombin Time  28.9 (*) 11.6 - 15.2 (seconds)    INR 2.67 (*) 0.00 - 1.49    CBC     Status: Abnormal   Collection Time   11/26/11  9:42 AM      Component Value Range Comment   WBC 15.8 (*) 4.0 - 10.5 (K/uL)    RBC 1.44 (*) 4.22 - 5.81 (MIL/uL)    Hemoglobin 3.3 (*) 13.0 - 17.0 (g/dL)    HCT 40.9 (*) 81.1 - 52.0 (%)    MCV 74.3 (*) 78.0 - 100.0 (fL)    MCH 22.9 (*) 26.0 - 34.0 (pg)    MCHC 30.8  30.0 - 36.0 (g/dL)    RDW 91.4 (*) 78.2 - 15.5 (%)    Platelets 94 (*) 150 - 400 (K/uL) PLATELET COUNT CONFIRMED BY SMEAR  COMPREHENSIVE METABOLIC PANEL     Status: Abnormal   Collection Time   11/26/11  9:42 AM      Component Value Range Comment   Sodium 123 (*) 135 - 145 (mEq/L)    Potassium 5.0  3.5 - 5.1 (mEq/L)    Chloride 98  96 - 112 (mEq/L)    CO2 15 (*) 19 - 32 (mEq/L)    Glucose, Bld 107 (*) 70 - 99 (mg/dL)    BUN 21  6 - 23 (mg/dL)    Creatinine, Ser 1.61  0.50 - 1.35 (mg/dL)    Calcium 6.8 (*) 8.4 - 10.5 (mg/dL)    Total Protein 3.5 (*) 6.0 - 8.3 (g/dL)    Albumin 1.6 (*) 3.5 - 5.2 (g/dL)    AST 34  0 - 37 (U/L)    ALT 24  0 - 53 (U/L)    Alkaline Phosphatase 55  39 - 117 (U/L)    Total Bilirubin 0.9  0.3 - 1.2 (mg/dL)    GFR calc non Af Amer >90  >90 (mL/min)    GFR calc Af Amer >90  >90 (mL/min)   LIPASE, BLOOD     Status: Abnormal   Collection Time   11/26/11  9:42 AM      Component Value Range Comment   Lipase 109 (*) 11 - 59 (U/L)   LACTATE DEHYDROGENASE     Status: Normal   Collection Time   11/26/11 10:57 AM      Component Value Range Comment   LD 192  94 - 250 (U/L)   LACTIC ACID, PLASMA     Status: Abnormal   Collection Time   11/26/11 10:57 AM      Component Value Range Comment   Lactic Acid, Venous 4.9 (*) 0.5 - 2.2 (mmol/L)   PROCALCITONIN     Status: Normal   Collection Time   11/26/11 10:57 AM      Component Value Range Comment   Procalcitonin 0.19     ETHANOL     Status: Normal   Collection Time   11/26/11 10:57 AM      Component Value Range Comment   Alcohol, Ethyl  (B) <11  0 - 11 (mg/dL)   SALICYLATE LEVEL     Status: Abnormal   Collection Time   11/26/11 10:57 AM      Component Value Range Comment   Salicylate Lvl <2.0 (*) 2.8 - 20.0 (mg/dL)   GLUCOSE, CAPILLARY     Status: Abnormal   Collection Time   11/26/11 11:56 AM      Component Value Range Comment   Glucose-Capillary 107 (*) 70 - 99 (mg/dL)     Dg Chest Port 1 View  11/26/2011  *RADIOLOGY REPORT*  Clinical Data: Abdominal pain.  PORTABLE CHEST - 1 VIEW  Comparison: None.  Findings: Left lateral aspect of the chest not completely included on the present exam.  Poor aspiration with elevated hemidiaphragms more notable on the right.  This limits evaluation of the lung bases.  Pulmonary vascular congestion.  Subsegmental atelectatic changes versus overlying structure right lower lobe.  Prominence of the mediastinal and cardiac silhouette may be related to technique.  The patient would eventually benefit from follow-up two-view chest with cardiac leads removed.  No gross pneumothorax.  Calcified tortuous aorta.  IMPRESSION: Limited poor respiratory portable examination with pulmonary vascular congestion.  Original Report Authenticated By: Fuller Canada, M.D.    ROS:  As stated above in the HPI otherwise negative.  Blood pressure 58/22, pulse 101, temperature 95.5 F (35.3 C), temperature source Rectal, resp. rate 15, height 5\' 9"  (1.753 m), weight 160 lb (72.576 kg), SpO2 100.00%.  PE: Gen: NAD, Alert and Oriented HEENT:  [place]/AT, EOMI Neck: Supple, no LAD Lungs: CTA Bilaterally CV: RRR without M/G/R ABM: Soft, moderately distended- positive fluid wave. No tenderness or guarding or rigidity , +BS. Ext: No C/C/E  Assessment/Plan:  1) UGI Bleeding: Most likely 2/2 Variceal bleeding vs. Bleeding Peptic  Ulcer. Taking NSAIDs for past few days for low back pain. Hx of Alcoholic Cirrhosis w/ Varices. All previous treatments in [place]. Unclear when had last bleeding episode before this  admission. No more vomiting or bloody stools since in hospital. MAP- in 30s.   - Hb 3.3 on admission. - NPO for now. - Fifth normal saline bolus is running.  - Waiting for PRBC transfusion - will need about 4-6 units . - Continue Octreotide and Protonix drip per CCM. - Upper endoscopy likely tomorrow - unless more episodes of hematemesis. - Consider SBP prophylaxis.  2) Hypovolemic shock : Most likely from GI bleeding . - MAP in 30s . - Management per CCM. - Alcohol level and salicylate level normal. - Lactate 4.9- most likely 2/2 anaerobic metabolic due to low hemoglobin - PRBCs and continue normal bolus saline infusions. - No signs of renal failure or other endorgan damage at present.   Rennae Ferraiolo 11/26/2011, 12:04 PM

## 2011-11-26 NOTE — Progress Notes (Signed)
UR Completed.  Clayton Watson Jane [phone] 11/26/2011  

## 2011-11-26 NOTE — H&P (Signed)
Name: Clayton Watson MRN: [id number] DOB: Dec 22, 1960    LOS: 0  [place] [facility] / Critical Care Note   History of Present Illness:  Patient is a 52 year old male with a past medical history of alcoholic cirrhosis with recently moved to [place] from [place] to live with his mother, presented to the [facility] ED with complaint of fatigue and dark stools for the past week, pt reports not having a PCP due to insurance reasons, not currently taking any medications. His mother also reported that he had an episode of upper GI bleed this morning prior to EMS arrival. Currently denies any fever or chills, no CP, SOB, or N/V/D.  Lines / Drains: None  Cultures: UA 4/1>> BCx 4/1>>  Antibiotics: None  Tests / Events: Hg 3.3 in the ED >> transfuse 4 units of PRBC   Past Medical History  Diagnosis Date  . Cirrhosis of liver   . Ascitic fluid     Past Surgical History  Procedure Date  . Hernia repair   . Appendectomy     Prior to Admission medications   Medication Sig Start Date End Date Taking? Authorizing Provider  furosemide (LASIX) 80 MG tablet Take 1 tablet (80 mg total) by mouth 2 (two) times daily. 09/25/11 09/24/12 Yes Gavin Pound. Ghim, MD  ibuprofen (ADVIL,MOTRIN) 200 MG tablet Take 400 mg by mouth every 6 (six) hours as needed. For pain   Yes Historical Provider, MD  Multiple Vitamin (MULITIVITAMIN WITH MINERALS) TABS Take 1 tablet by mouth daily.   Yes Historical Provider, MD  Omeprazole Magnesium 20.6 (20 BASE) MG CPDR Take 1 capsule by mouth daily.   Yes Historical Provider, MD  potassium chloride SA (K-DUR,KLOR-CON) 20 MEQ tablet Take 1 tablet (20 mEq total) by mouth 2 (two) times daily. 10/17/11 10/16/12 Yes Ardyth Gal, MD  spironolactone (ALDACTONE) 100 MG tablet Take 100 mg by mouth 4 (four) times daily. 10/17/11 10/16/12 Yes Ardyth Gal, MD    Allergies No Known Allergies  Family History History reviewed. No pertinent family history.  Social History  reports that he has been smoking.  He does not have any smokeless tobacco history on file. He reports that he does not drink alcohol or use illicit drugs.  Review Of Systems:  Negative except per HPI  Vital Signs: Temp:  [95.5 F (35.3 C)] 95.5 F (35.3 C) (04/01 0918) Pulse Rate:  [96-100] 96  (04/01 1030) Resp:  [15-24] 15  (04/01 1015) BP: (67-82)/(37-45) 70/38 mmHg (04/01 1030) SpO2:  [93 %-100 %] 100 % (04/01 1030) FiO2 (%):  [100 %] 100 % (04/01 0918) Weight:  [160 lb (72.576 kg)] 160 lb (72.576 kg) (04/01 4098)    Physical Examination: General:  somnolent, well-developed, and cooperative to examination.   Head:  normocephalic and atraumatic.   Lungs:  normal respiratory effort, no accessory muscle use, normal breath sounds, no crackles, and no wheezes. Heart:  normal rate, regular rhythm, no murmur, no gallop, and no rub.   Abdomen:  distended with positive fluid shift, liver palpable below costal margin. Msk:  no joint swelling, no joint warmth, and no redness over joints.   Pulses:  2+ DP/PT pulses bilaterally Extremities:  No cyanosis, clubbing, edema Neurologic:  alert & oriented X3,    Ventilator settings: Vent Mode:  [-]  FiO2 (%):  [100 %] 100 %  Labs    CBC  Lab 11/26/11 0942  HGB 3.3*  HCT 10.7*  WBC 15.8*  PLT 94*    BMET  Lab 11/26/11 0942  NA 123*  K 5.0  CL 98  CO2 15*  GLUCOSE 107*  BUN 21  CREATININE 0.72  CALCIUM 6.8*  MG --  PHOS --     Lab 11/26/11 0942  INR 2.67*    Radiology: CXR 4/1 >> Poor respiratory effort, no infiltrate, diffuse mild pulmonary vascular congestion.    Assessment and Plan:  GI Bleed - Melena: Patient presents with several episodes of black tarry stool, first started yesterday according to patient, likely varices related given history of cirrhosis.  Hg 3.3 on presentation. Also reports one episode of hematemesis this morning prior to EMS arrival - Admit to ICU - Monitor CBC q4h - NPO except for  meds - Protonix and Octreotide drip - Zofran as need for nausea or vomiting - Transfuse 4 units of PRBC for aim Hemoglobin >7.  - Consider GI consult while as inpatient for possible EGD  Hypotension - Likely secondary to current anemic state secondary to GI bleed, transfuse 4 units of PRBC and IVF at 250cc/hr  Cirrhosis - secondary to alcohol abuse, MELD score 14 which has a 90 day mortality of 11%, continue to monitor closely.   Electrolyte derangement - with Anion Gap of 10, serum Na of 123 and HCO3 of 15. Check salsalate level, and lactic acid level to evaluate for a cause of his current acidosis.   Leukocytosis -  Patient has a WBC of 15.8 likely stress related. Patient is afebrile, we will check a UA and blood cultures to look for infection. CXR is not suggestive of pneumonia. Will hold off antibiotics for now.      Best practices / Disposition: -->Code Status: Full -->DVT Px: SCD -->GI Px: Protonix -->Diet: NPO  Darnelle Maffucci, MD  11/26/2011, 10:49 AM  Decrease IVF to avoid additional pulmonary edema and worsening ascites.  Titrate O2 up.  Hold diuretics and if pressors are needed will need to place TLC.  Will admit to the ICU for observation overnight, will transfuse and ask GI to evaluate patient.  Will likely need a paracentesis later but will evaluate after vital signs are more stable.  Patient seen and examined, agree with above note.  I dictated the care and orders written for this patient under my direction.  CC time 55 min.  Koren Bound, M.D. [phone]

## 2011-11-26 NOTE — Progress Notes (Signed)
The patient refuses to wear a patient gown. He would not let me take his blood pressures regularly during the blood administration because he stated "it hurts my arm too much, and all I care about is getting this blood." He only wants the blood and wants to be left alone otherwise.  Milinda Cave, RN

## 2011-11-26 NOTE — ED Notes (Signed)
Lab contacted RN.  Critical hemoglobin 3.3  Notified patient's RN and MD.   RN states the blood was drawn correctly

## 2011-11-26 NOTE — ED Notes (Signed)
MD at bedside.dr Marlan Palau with intensivist at bedside,

## 2011-11-26 NOTE — Progress Notes (Signed)
Contacted by multiple MD's, patient evidently has been less than pleasant to many of the providers.  I spoke with him about the importance of working as a team.  He is alert and oriented to self, place and time and is able to reason successfully.  We discussed code status, he informed me that he wishes no intubation regardless of consequences.  I asked him even if that means dying and he said yes.  I informed him that that also means to CPR and no cardioversion and he was ok with that.  He also refused central access and refusing BP monitoring.  Patient will be made a full NCB at this point and we are still awaiting blood from [facility] for transfusion.  Alyson Reedy, M.D. [facility]monary/Critical Care Medicine. Pager: [phone]. After hours pager: [phone].

## 2011-11-27 ENCOUNTER — Encounter (HOSPITAL_COMMUNITY)
Admission: EM | Disposition: A | Discharge status: Home / Self Care | Payer: Self-pay | Source: Home / Self Care | Attending: Pulmonary Disease

## 2011-11-27 DIAGNOSIS — K920 Hematemesis: Secondary | ICD-10-CM

## 2011-11-27 DIAGNOSIS — R578 Other shock: Secondary | ICD-10-CM

## 2011-11-27 DIAGNOSIS — K7460 Unspecified cirrhosis of liver: Secondary | ICD-10-CM

## 2011-11-27 LAB — CBC
HCT: 20.7 % — ABNORMAL LOW (ref 39.0–52.0)
MCH: 26.8 pg (ref 26.0–34.0)
MCHC: 34.3 g/dL (ref 30.0–36.0)
MCV: 78.1 fL (ref 78.0–100.0)
Platelets: 102 10*3/uL — ABNORMAL LOW (ref 150–400)
RDW: 17.1 % — ABNORMAL HIGH (ref 11.5–15.5)

## 2011-11-27 LAB — HIV ANTIBODY (ROUTINE TESTING W REFLEX): HIV: NONREACTIVE

## 2011-11-27 LAB — HEMOGLOBIN A1C
Hgb A1c MFr Bld: 6 % — ABNORMAL HIGH (ref <5.7)
Mean Plasma Glucose: 126 mg/dL — ABNORMAL HIGH (ref <117)

## 2011-11-27 LAB — HEPATITIS PANEL, ACUTE: Hepatitis B Surface Ag: NEGATIVE

## 2011-11-27 SURGERY — EGD (ESOPHAGOGASTRODUODENOSCOPY)
Anesthesia: Moderate Sedation

## 2011-11-27 NOTE — Progress Notes (Signed)
0900 I U PRBC completed. VSS: HR 104, BP 90/19. No transfusion reaction suspected. Temp 98.2 axillary. Will continue to monitor patient

## 2011-11-27 NOTE — Progress Notes (Signed)
  Patient reports feeling thirsty and wants to drink a cup of water. He states that he will leave hospital at 9:00 AM. I saw patient who is having bowel movement. I checked his stool which still has bright red blood in it. He threats to leave hospital by sighing the document against medical advice if he is not allowed to drink water. After being educated on the importance of keeping him on NPO, gave him a few ice chips, he agreed to continue on NPO. He then refused to put on his cardiac monitor. Patient was educated on the importance of heat monitor. He finally agreed to be on heart monitor.  Patient completed transfusion of 4 Units of blood. His bp is 90/55 and HR is 101. CBC pending   Lorretta Harp

## 2011-11-27 NOTE — Progress Notes (Signed)
Name: Aqeel Norgaard MRN: [id number] DOB: [birth year] LOS: 1  PCCM PROGRESS NOTE  Subjective:  The patient is alert and oriented times 4 this morning and states that he does not wish any labs drawn on him and has had a hard time the last day. He is willing to accept one more unit of blood although he has been declining blood pressure checks and labs. He is adamant that he will leave at 9:30 AM once his ride arrives. Still passing blood in his stools but no repeat hematemasis overnight.   Allergies No Known Allergies  Vital Signs: Filed Vitals:   11/27/11 0605  BP: 72/49  Pulse: 110  Temp:   Resp: 21   I/O last 3 completed shifts: In: 9342.9 [I.V.:8431.9; Blood:911] Out: 604 [Urine:600; Stool:4]  Physical Examination: General:  Alert and oriented times 4, conversant, grumpy Neuro:  No deficits, strength intact bilat upper and lower extremities HEENT:  EOM intact, PERRLA, mucous membranes moist Neck:  Supple, no bruits Cardiovascular:  S1 S2 heard Lungs:  Clear to ausculatation, no wheezes or rales Abdomen:  Distended, fluid shift, non-tender to palpation Extremities: No clubbing, cyanosis Skin:  Intact, bruise over right arm where attempt to start IV failed  Labs: Last CBC:  CBC    Component Value Date/Time   WBC 22.1* 11/27/2011 0109   RBC 2.65* 11/27/2011 0109   HGB 7.1* 11/27/2011 0109   HCT 20.7* 11/27/2011 0109   PLT 102* 11/27/2011 0109   MCV 78.1 11/27/2011 0109   MCH 26.8 11/27/2011 0109   MCHC 34.3 11/27/2011 0109   RDW 17.1* 11/27/2011 0109   LYMPHSABS 2.2 11/26/2011 2055   MONOABS 0.4 11/26/2011 2055   EOSABS 0.2 11/26/2011 2055   BASOSABS 0.0 11/26/2011 2055   TSH: normal HIV: NR BMP: Na 127, K 5.3, Cl 103, Bicarb 16, BUN 24, Cr 0.68, Glu 119 Pro BNP 228 INR 2.7 Lactic acid 4.9 CE: trop -  Hepatitis panel: pend HgA1C: pend Ethanol -  ASA -  Procalcitonin 0.19 Lipase 109  Lines, Tubes, etc: 2 peripheral lines  Microbiology: U/A negative Blood 4/1  Antibiotics:    None  Studies/Events: CXR 4/1 poor quality, some vascular congestion  Consults: GI  Assessment and Plan: Upper GI bleed - GI is consulted, s/p 4 units PRBC. Last Hg 7.1 @ 0100. INR 2.7 on admission. Did extensively advise pt to stay for treatment however he is insisting on leaving AMA at this time.   Hypotension - Likely secondary to acute blood loss, did get 4 units PRBCs, will give one more unit this AM as he is still passing blood in stools. Respiratory status is still clear and no impairment to his breathing at this time.   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 Does have coagulopathy with INR 2.7  Leukocytosis - On admission was 15.8 and is now 22.1. No source of infection, abdomen is non-tender. Waiting on blood cultures, U/A negative, CXR non-infectious. No antibiotics and will hold off for now.   Electrolyte derangement - AG is 9 and closed likely increased from lactic acidosis, Na is diluted likely from all the fluids and blood products he has received.   Best Practice: DVT: SCDs SUP: Protonix IV drip Nutrition: NPO Glycemic control:  Sedation/analgesia:   Genella Mech 11/27/2011, 6:37 AM  Pt seen and examined and database reviewed. I agree with above findings, assessment and plan  Billy Fischer, MD;  PCCM service; Mobile [phone]

## 2011-11-27 NOTE — Progress Notes (Signed)
0845 Pts BP 75/46, HR 109. Patient alert and oriented laying in bed. Dr. Sung Amabile made aware of hypotension. Will continue to monitor patient.

## 2011-11-27 NOTE — Discharge Summary (Signed)
Internal Medicine Teaching [facility] Discharge Note  Name: Clayton Watson MRN: [id number] DOB: Apr 16, 1961 51 y.o.  Date of Admission: 11/26/2011  9:02 AM Date of Discharge: 11/27/2011 Attending Physician: Dr. Sung Amabile  Discharge Diagnosis: Active Problems:  Shock  Liver failure  Pulmonary edema  Hypoxemia  Coagulopathy Upper GI bleed  Discharge Medications: Medication List  As of 11/27/2011 11:56 AM   ASK your doctor about these medications         furosemide 80 MG tablet   Commonly known as: LASIX   Take 1 tablet (80 mg total) by mouth 2 (two) times daily.      ibuprofen 200 MG tablet   Commonly known as: ADVIL,MOTRIN   Take 400 mg by mouth every 6 (six) hours as needed. For pain      mulitivitamin with minerals Tabs   Take 1 tablet by mouth daily.      Omeprazole Magnesium 20.6 (20 BASE) MG Cpdr   Take 1 capsule by mouth daily.      potassium chloride SA 20 MEQ tablet   Commonly known as: K-DUR,KLOR-CON   Take 1 tablet (20 mEq total) by mouth 2 (two) times daily.      spironolactone 100 MG tablet   Commonly known as: ALDACTONE   Take 100 mg by mouth 4 (four) times daily.            Disposition and follow-up:   [name] was discharged from [facility] in Serious condition.    Follow-up Appointments: Follow-up Information    None, pt left AMA          Consultations:  GI, Critical Care  Procedures Performed:  Dg Chest Port 1 View  11/26/2011  *RADIOLOGY REPORT*  Clinical Data: Abdominal pain.  PORTABLE CHEST - 1 VIEW  Comparison: None.  Findings: Left lateral aspect of the chest not completely included on the present exam.  Poor aspiration with elevated hemidiaphragms more notable on the right.  This limits evaluation of the lung bases.  Pulmonary vascular congestion.  Subsegmental atelectatic changes versus overlying structure right lower lobe.  Prominence of the mediastinal and cardiac silhouette may be related to technique.  The  patient would eventually benefit from follow-up two-view chest with cardiac leads removed.  No gross pneumothorax.  Calcified tortuous aorta.  IMPRESSION: Limited poor respiratory portable examination with pulmonary vascular congestion.  Original Report Authenticated By: Fuller Canada, M.D.   Admission HPI:  Patient is a 51 year old male with a past medical history of alcoholic cirrhosis with recently moved to [place] from [place] to live with his mother, presented to the [facility] ED with complaint of fatigue and dark stools for the past week, pt reports not having a PCP due to insurance reasons, not currently taking any medications. His mother also reported that he had an episode of upper GI bleed this morning prior to EMS arrival. Currently denies any fever or chills, no CP, SOB, or N/V/D.  Hospital Course by problem list:  Pt Left AMA - The patient was extensively counseled that he did have a very serious condition which required medical attention. He was informed that the consequence of leaving AMA is death or serious injury. He did acknowledge these risks and showed understanding.  He did express coherence and ability to make his own decisions. However, he did elect to leave the hospital AMA. Dr. Sung Amabile and myself did spend an extensive amount of time counseling the patient to not leave.   Upper  GI bleed - Pt's Hg on admission was 3.3 and he did receive 5 units of PRBCs during this admission however he did not elect to have EGD to determine the nature of the cause. He gives history of ulcers bleeding in the past however with his cirrhosis and impaired INR it may be related to varices. Still having bloody stools overnight and morning of leaving AMA. He was still having very low BP and was advised to stay. He did refuse lab draws while in the hospital as he was inconvenienced by the draws.    Shock - Pt was in shock during this hospitalization and did receive 5 units of PRBCs and fluids during stay in  the hospital. Prior to leaving AMA his BP was still 70s/20s. He was made aware of the serious nature of his condition. He did decide to leave and did have capacity to make the decision. He was refusing BP checks as the cuff hurt his arm and he did not want it done.    Liver failure - Pt with history of cirrhosis alcohol induced. MELD score 19 with 90 day mortality of 11%.   Pulmonary edema - Likely due to increased fluids during this stay. Pt did not have respiratory decline or distress during this stay or prior to leaving AMA. Advised not to leave AMA.    Hypoxemia - Pt did have decreased oxygen saturation due to acute blood loss. Pt was advised to stay for further treatment however elected to leave AMA.    Coagulopathy - Pt's INR was 2.67 on admission likely due to his longstanding cirrhosis. This may have helped precipitate his upper GI bleed and he was advised of the serious nature of his condition however he did elect to leave AMA.     Discharge Vitals:  BP 81/42  Pulse 105  Temp(Src) 98.2 F (36.8 C) (Axillary)  Resp 19  Ht 5\' 9"  (1.753 m)  Wt 160 lb (72.576 kg)  BMI 23.63 kg/m2  SpO2 97%  Discharge Labs:  Results for orders placed during the hospital encounter of 11/26/11 (from the past 24 hour(s))  MRSA PCR SCREENING     Status: Normal   Collection Time   11/26/11 12:10 PM      Component Value Range   MRSA by PCR NEGATIVE  NEGATIVE   OCCULT BLOOD X 1 CARD TO LAB, STOOL     Status: Normal   Collection Time   11/26/11  3:25 PM      Component Value Range   Fecal Occult Bld NEGATIVE    PREPARE RBC (CROSSMATCH)     Status: Normal   Collection Time   11/26/11  7:10 PM      Component Value Range   Order Confirmation ORDER PROCESSED BY BLOOD BANK    URINE RAPID DRUG SCREEN (HOSP PERFORMED)     Status: Abnormal   Collection Time   11/26/11  7:31 PM      Component Value Range   Opiates NONE DETECTED  NONE DETECTED    Cocaine NONE DETECTED  NONE DETECTED    Benzodiazepines NONE  DETECTED  NONE DETECTED    Amphetamines NONE DETECTED  NONE DETECTED    Tetrahydrocannabinol POSITIVE (*) NONE DETECTED    Barbiturates NONE DETECTED  NONE DETECTED   URINALYSIS, ROUTINE W REFLEX MICROSCOPIC     Status: Normal   Collection Time   11/26/11  7:31 PM      Component Value Range   Color, Urine YELLOW  YELLOW  APPearance CLEAR  CLEAR    Specific Gravity, Urine 1.017  1.005 - 1.030    pH 5.5  5.0 - 8.0    Glucose, UA NEGATIVE  NEGATIVE (mg/dL)   Hgb urine dipstick NEGATIVE  NEGATIVE    Bilirubin Urine NEGATIVE  NEGATIVE    Ketones, ur NEGATIVE  NEGATIVE (mg/dL)   Protein, ur NEGATIVE  NEGATIVE (mg/dL)   Urobilinogen, UA 0.2  0.0 - 1.0 (mg/dL)   Nitrite NEGATIVE  NEGATIVE    Leukocytes, UA NEGATIVE  NEGATIVE   PRO B NATRIURETIC PEPTIDE     Status: Abnormal   Collection Time   11/26/11  8:54 PM      Component Value Range   Pro B Natriuretic peptide (BNP) 228.4 (*) 0 - 125 (pg/mL)  CARDIAC PANEL(CRET KIN+CKTOT+MB+TROPI)     Status: Abnormal   Collection Time   11/26/11  8:54 PM      Component Value Range   Total CK 146  7 - 232 (U/L)   CK, MB 6.9 (*) 0.3 - 4.0 (ng/mL)   Troponin I <0.30  <0.30 (ng/mL)   Relative Index 4.7 (*) 0.0 - 2.5   TSH     Status: Normal   Collection Time   11/26/11  8:55 PM      Component Value Range   TSH 0.439  0.350 - 4.500 (uIU/mL)  CBC     Status: Abnormal   Collection Time   11/26/11  8:55 PM      Component Value Range   WBC 20.0 (*) 4.0 - 10.5 (K/uL)   RBC 2.33 (*) 4.22 - 5.81 (MIL/uL)   Hemoglobin 6.1 (*) 13.0 - 17.0 (g/dL)   HCT 16.1 (*) 09.6 - 52.0 (%)   MCV 77.7 (*) 78.0 - 100.0 (fL)   MCH 26.2  26.0 - 34.0 (pg)   MCHC 33.7  30.0 - 36.0 (g/dL)   RDW 04.5 (*) 40.9 - 15.5 (%)   Platelets 92 (*) 150 - 400 (K/uL)  HEMOGLOBIN A1C     Status: Abnormal   Collection Time   11/26/11  8:55 PM      Component Value Range   Hemoglobin A1C 6.0 (*) <5.7 (%)   Mean Plasma Glucose 126 (*) <117 (mg/dL)  HIV ANTIBODY (ROUTINE TESTING)     Status:  Normal   Collection Time   11/26/11  8:55 PM      Component Value Range   HIV NON REACTIVE  NON REACTIVE   DIFFERENTIAL     Status: Abnormal   Collection Time   11/26/11  8:55 PM      Component Value Range   Neutrophils Relative 86 (*) 43 - 77 (%)   Lymphocytes Relative 11 (*) 12 - 46 (%)   Monocytes Relative 2 (*) 3 - 12 (%)   Eosinophils Relative 1  0 - 5 (%)   Basophils Relative 0  0 - 1 (%)   Neutro Abs 17.2 (*) 1.7 - 7.7 (K/uL)   Lymphs Abs 2.2  0.7 - 4.0 (K/uL)   Monocytes Absolute 0.4  0.1 - 1.0 (K/uL)   Eosinophils Absolute 0.2  0.0 - 0.7 (K/uL)   Basophils Absolute 0.0  0.0 - 0.1 (K/uL)   RBC Morphology POLYCHROMASIA PRESENT     Smear Review LARGE PLATELETS PRESENT    BASIC METABOLIC PANEL     Status: Abnormal   Collection Time   11/26/11  8:55 PM      Component Value Range   Sodium 127 (*)  135 - 145 (mEq/L)   Potassium 5.3 (*) 3.5 - 5.1 (mEq/L)   Chloride 102  96 - 112 (mEq/L)   CO2 16 (*) 19 - 32 (mEq/L)   Glucose, Bld 119 (*) 70 - 99 (mg/dL)   BUN 24 (*) 6 - 23 (mg/dL)   Creatinine, Ser 4.09  0.50 - 1.35 (mg/dL)   Calcium 7.1 (*) 8.4 - 10.5 (mg/dL)   GFR calc non Af Amer >90  >90 (mL/min)   GFR calc Af Amer >90  >90 (mL/min)  CALCIUM, IONIZED     Status: Abnormal   Collection Time   11/26/11  8:56 PM      Component Value Range   Calcium, Ion 1.08 (*) 1.12 - 1.32 (mmol/L)  CBC     Status: Abnormal   Collection Time   11/27/11  1:09 AM      Component Value Range   WBC 22.1 (*) 4.0 - 10.5 (K/uL)   RBC 2.65 (*) 4.22 - 5.81 (MIL/uL)   Hemoglobin 7.1 (*) 13.0 - 17.0 (g/dL)   HCT 81.1 (*) 91.4 - 52.0 (%)   MCV 78.1  78.0 - 100.0 (fL)   MCH 26.8  26.0 - 34.0 (pg)   MCHC 34.3  30.0 - 36.0 (g/dL)   RDW 78.2 (*) 95.6 - 15.5 (%)   Platelets 102 (*) 150 - 400 (K/uL)    Signed: Genella Mech 11/27/2011, 11:56 AM    Pt seen and examined and database reviewed. I agree with above findings, assessment and plan  Billy Fischer, MD;  PCCM service; Mobile [phone]

## 2011-11-27 NOTE — Progress Notes (Signed)
4540 Pt insists on leaving AMA against the recommendation of Dr. Sung Amabile CCM MD. RN at bedside with Dr. Sung Amabile to reinforce the life-threatening consequences of leaving with a GI bleed. The critical care team recommends further evaluation and treatment which has been refused again by the patient. The patient is alert, oriented and competent to make his own medical decisions as witnessed by Dr. Sung Amabile. Patient signed the AMA form. Patient refuses all ICU monitoring and all medications at this time. Patient assisted to a chair by RN and is awaiting his mother to take him home.

## 2011-11-27 NOTE — Progress Notes (Signed)
[id number] Patient mother arrived. RN wheeled (wheelchair) patient downstairs to meet mother in lobby. Patient wheeled to [place] door and assisted inside. All belongings with patient.

## 2011-11-27 NOTE — Progress Notes (Signed)
Patient refused his blood pressure being taken states "he is fine and it will be the same reading as the last one." Patient educated on need for close blood pressure monitoring. Will re address later with patient. [name]

## 2011-11-27 NOTE — Progress Notes (Signed)
0700 Report taken from Constance Haw RN and chart reviewed. Patient is currently getting a blood transfusion. Patient states "I am leaving at 9:30. All I need is my blood and I am leaving with my mother." RN explained the importance of his hospitalization given his current medical condition. Patient refused any form of assessment despite RN's explanation of the purpose and importance. RN discussed issue with the charge RN Tina Griffiths). Will discuss issue [facility] MD.

## 2011-11-27 NOTE — Significant Event (Signed)
Informed by RN that pt has insisted on leaving AMA. I spoke with him and he understands that he has a life-threatening GI bleed and understands that the consequences are that he could die from this without proper medical evaluation and treatment. He is fully competent to make this decision and persists in his insistence to leave despite my emphasis that it is a very bad decision. He has agreed to sign the AMA papers and understands that he is fully liable for the consequences of this decision whatever they may be.  Clayton Fischer, MD;  PCCM service; Mobile [phone]

## 2011-11-27 NOTE — Progress Notes (Signed)
Patient is refusing any blood being drawn for labs. Patient was educated on the purpose of these labs and the importance with his GI bleed and other medical issues. Patient still refused. MD Clyde Lundborg was notified.  [name]

## 2011-12-04 ENCOUNTER — Encounter: Payer: Self-pay | Admitting: Gastroenterology

## 2011-12-05 ENCOUNTER — Emergency Department (HOSPITAL_COMMUNITY)
Admission: EM | Admit: 2011-12-05 | Discharge: 2011-12-05 | Disposition: A | Discharge status: Home / Self Care | Payer: Medicaid Other | Attending: Emergency Medicine | Admitting: Emergency Medicine

## 2011-12-05 ENCOUNTER — Emergency Department (HOSPITAL_COMMUNITY): Payer: Medicaid Other

## 2011-12-05 ENCOUNTER — Encounter (HOSPITAL_COMMUNITY): Payer: Self-pay | Admitting: *Deleted

## 2011-12-05 DIAGNOSIS — K7460 Unspecified cirrhosis of liver: Secondary | ICD-10-CM | POA: Insufficient documentation

## 2011-12-05 DIAGNOSIS — E871 Hypo-osmolality and hyponatremia: Secondary | ICD-10-CM

## 2011-12-05 DIAGNOSIS — Z79899 Other long term (current) drug therapy: Secondary | ICD-10-CM | POA: Insufficient documentation

## 2011-12-05 DIAGNOSIS — R141 Gas pain: Secondary | ICD-10-CM | POA: Insufficient documentation

## 2011-12-05 DIAGNOSIS — R188 Other ascites: Secondary | ICD-10-CM | POA: Insufficient documentation

## 2011-12-05 DIAGNOSIS — R143 Flatulence: Secondary | ICD-10-CM | POA: Insufficient documentation

## 2011-12-05 DIAGNOSIS — F17200 Nicotine dependence, unspecified, uncomplicated: Secondary | ICD-10-CM | POA: Insufficient documentation

## 2011-12-05 DIAGNOSIS — R109 Unspecified abdominal pain: Secondary | ICD-10-CM | POA: Insufficient documentation

## 2011-12-05 DIAGNOSIS — R142 Eructation: Secondary | ICD-10-CM | POA: Insufficient documentation

## 2011-12-05 DIAGNOSIS — R0602 Shortness of breath: Secondary | ICD-10-CM | POA: Insufficient documentation

## 2011-12-05 LAB — DIFFERENTIAL
Eosinophils Absolute: 0.3 10*3/uL (ref 0.0–0.7)
Eosinophils Relative: 3 % (ref 0–5)
Lymphs Abs: 1.9 10*3/uL (ref 0.7–4.0)
Monocytes Absolute: 0.3 10*3/uL (ref 0.1–1.0)
Monocytes Relative: 3 % (ref 3–12)

## 2011-12-05 LAB — CBC
HCT: 24.2 % — ABNORMAL LOW (ref 39.0–52.0)
Hemoglobin: 7.8 g/dL — ABNORMAL LOW (ref 13.0–17.0)
MCH: 26.2 pg (ref 26.0–34.0)
MCV: 81.2 fL (ref 78.0–100.0)
RBC: 2.98 MIL/uL — ABNORMAL LOW (ref 4.22–5.81)

## 2011-12-05 LAB — POCT I-STAT, CHEM 8
Glucose, Bld: 166 mg/dL — ABNORMAL HIGH (ref 70–99)
HCT: 25 % — ABNORMAL LOW (ref 39.0–52.0)
Hemoglobin: 8.5 g/dL — ABNORMAL LOW (ref 13.0–17.0)
Potassium: 4.3 mEq/L (ref 3.5–5.1)

## 2011-12-05 LAB — COMPREHENSIVE METABOLIC PANEL
Alkaline Phosphatase: 92 U/L (ref 39–117)
BUN: 9 mg/dL (ref 6–23)
Calcium: 7.8 mg/dL — ABNORMAL LOW (ref 8.4–10.5)
Creatinine, Ser: 0.61 mg/dL (ref 0.50–1.35)
GFR calc Af Amer: >90 mL/min (ref >90)
Glucose, Bld: 161 mg/dL — ABNORMAL HIGH (ref 70–99)
Potassium: 4.2 mEq/L (ref 3.5–5.1)
Total Protein: 5.3 g/dL — ABNORMAL LOW (ref 6.0–8.3)

## 2011-12-05 LAB — PROTIME-INR: Prothrombin Time: 19.3 seconds — ABNORMAL HIGH (ref 11.6–15.2)

## 2011-12-05 MED ORDER — ZOLPIDEM TARTRATE 10 MG PO TABS
5.0000 mg | ORAL_TABLET | Freq: Every evening | ORAL | Status: DC | PRN
Start: 2011-12-05 — End: 2011-12-27

## 2011-12-05 MED ORDER — MORPHINE SULFATE CR 15 MG PO TB12: 15.0000 mg | ORAL_TABLET | Freq: Every day | ORAL | Status: DC

## 2011-12-05 MED ORDER — MORPHINE SULFATE 4 MG/ML IJ SOLN
4.0000 mg | Freq: Once | INTRAMUSCULAR | Status: AC
  Administered 2011-12-05: 4 mg via INTRAVENOUS
  Filled 2011-12-05: qty 1

## 2011-12-05 NOTE — ED Provider Notes (Signed)
History     CSN: [id number]  Arrival date & time 12/05/11  4098   First MD Initiated Contact with Patient 12/05/11 [phone]      Chief Complaint  Patient presents with  . Abdominal Pain  . Shortness of Breath    (Consider location/radiation/quality/duration/timing/severity/associated sxs/prior treatment) HPI The patient is a 51 yo male with history of cirrhosis and frequent accumulation of ascites who presents today with diffuse abdominal pain and shortness of breath from abdominal distension.  He denies nausea, vomiting, diarrhea, constipation, and urinary symptoms.  He denies chest pain or blood in his stools.  His last paracentesis was 2 months ago.  He has an appointment to follow-up with his new GI doctor at [facility] in 3 weeks.  He describes 10/10 aching diffuse abdominal pain.  This is worse with movement and palpation and nothing that makes it better.There are no other associated or modifying factors.  Past Medical History  Diagnosis Date  . Cirrhosis of liver   . Ascitic fluid     Past Surgical History  Procedure Date  . Hernia repair   . Appendectomy     History reviewed. No pertinent family history.  History  Substance Use Topics  . Smoking status: Current Everyday Smoker -- 0.5 packs/day  . Smokeless tobacco: Not on file  . Alcohol Use: No      Review of Systems  Constitutional: Negative.   HENT: Negative.   Eyes: Negative.   Cardiovascular: Negative.   Gastrointestinal: Positive for abdominal pain and abdominal distention.  Genitourinary: Negative.   Musculoskeletal: Negative.   Skin: Negative.   Neurological: Negative.   Hematological: Negative.   Psychiatric/Behavioral: Negative.   All other systems reviewed and are negative.    Allergies  Nsaids  Home Medications   Current Outpatient Rx  Name Route Sig Dispense Refill  . FUROSEMIDE 80 MG PO TABS Oral Take 1 tablet (80 mg total) by mouth 2 (two) times daily. 60 tablet 0  . ADULT MULTIVITAMIN  W/MINERALS CH Oral Take 1 tablet by mouth daily.    Marland Kitchen OMEPRAZOLE MAGNESIUM 20.6 (20 BASE) MG PO CPDR Oral Take 1 capsule by mouth daily.    Marland Kitchen POTASSIUM CHLORIDE CRYS ER 20 MEQ PO TBCR Oral Take 20 mEq by mouth daily.    Marland Kitchen SPIRONOLACTONE 100 MG PO TABS Oral Take 100 mg by mouth 4 (four) times daily.    . MORPHINE SULFATE ER 15 MG PO TB12 Oral Take 1 tablet (15 mg total) by mouth daily. 30 tablet 0  . ZOLPIDEM TARTRATE 10 MG PO TABS Oral Take 0.5 tablets (5 mg total) by mouth at bedtime as needed for sleep. 30 tablet 0    BP 102/64  Pulse 88  Temp(Src) 98.3 F (36.8 C) (Oral)  Resp 20  SpO2 100%  Physical Exam  Nursing note and vitals reviewed. GEN: Well-developed, well-nourished male in no distress HEENT: Atraumatic, normocephalic. Oropharynx clear without erythema EYES: PERRLA BL, slight scleral icterus. NECK: Trachea midline, no meningismus CV: regular rate and rhythm. No murmurs, rubs, or gallops PULM: No respiratory distress.  No crackles, wheezes, or rales. GI: distended and tense with fluid wave. No guarding, rebound. + bowel sounds, ultrasound confirms significant ascites  GU: deferred Neuro: cranial nerves 2-12 intact, no abnormalities of strength or sensation, A and O x 3 MSK: Patient moves all 4 extremities symmetrically, no deformity, edema, or injury noted Skin: No rashes petechiae, purpura, or jaundice Psych: no abnormality of mood   ED Course  Procedures (including critical care time)  Labs Reviewed  CBC - Abnormal; Notable for the following:    RBC 2.98 (*)    Hemoglobin 7.8 (*)    HCT 24.2 (*)    RDW 20.0 (*)    Platelets 84 (*) CONSISTENT WITH PREVIOUS RESULT   All other components within normal limits  COMPREHENSIVE METABOLIC PANEL - Abnormal; Notable for the following:    Sodium 123 (*)    Chloride 94 (*)    Glucose, Bld 161 (*)    Calcium 7.8 (*)    Total Protein 5.3 (*)    Albumin 2.0 (*)    AST 54 (*)    Total Bilirubin 1.4 (*)    All other  components within normal limits  PROTIME-INR - Abnormal; Notable for the following:    Prothrombin Time 19.3 (*)    INR 1.60 (*)    All other components within normal limits  POCT I-STAT, CHEM 8 - Abnormal; Notable for the following:    Sodium 126 (*)    Chloride 95 (*)    Glucose, Bld 166 (*)    Hemoglobin 8.5 (*)    HCT 25.0 (*)    All other components within normal limits  DIFFERENTIAL   No results found.   1. Abdominal pain   2. Ascites   3. Hyponatremia       MDM  Patient was evaluated and had laboratory work-up for worsening of his liver failure. He was given morphine for his pain and consented for paracentesis.  Therapeutic tap was performed by radiology with significant improvement.  Patient has hyponatremia consistent with prior values.  This is likely secondary to the patient's hypervolemia which improved following paracentesis.  Diagnostic paracentesis was not warranted today as presentation was not consistent with SBP.  Anemia was consistent with prior values. Patient requested pain and sleep medications until his follow-up appointment.  He has already seen his new PCP who did not want to prescribe anything for him as he didn't want to cause any additional live problems.  Patient was prescribed oramorph and ambien with dosing consistent with prior prescriptions.  The patient was discharged in good condition.          Cyndra Numbers, MD 12/05/11 2205

## 2011-12-05 NOTE — ED Notes (Signed)
Pt remains in ultrasound. Holding room for pt

## 2011-12-05 NOTE — Discharge Instructions (Signed)
Abdominal Pain Abdominal pain can be caused by many things. Your caregiver decides the seriousness of your pain by an examination and possibly blood tests and X-rays. Many cases can be observed and treated at home. Most abdominal pain is not caused by a disease and will probably improve without treatment. However, in many cases, more time must pass before a clear cause of the pain can be found. Before that point, it may not be known if you need more testing, or if hospitalization or surgery is needed. HOME CARE INSTRUCTIONS   Do not take laxatives unless directed by your caregiver.   Take pain medicine only as directed by your caregiver.   Only take over-the-counter or prescription medicines for pain, discomfort, or fever as directed by your caregiver.   Try a clear liquid diet (broth, tea, or water) for as long as directed by your caregiver. Slowly move to a bland diet as tolerated.  SEEK IMMEDIATE MEDICAL CARE IF:   The pain does not go away.   You have a fever.   You keep throwing up (vomiting).   The pain is felt only in portions of the abdomen. Pain in the right side could possibly be appendicitis. In an adult, pain in the left lower portion of the abdomen could be colitis or diverticulitis.   You pass bloody or black tarry stools.  MAKE SURE YOU:   Understand these instructions.   Will watch your condition.   Will get help right away if you are not doing well or get worse.  Document Released: 05/23/2005 Document Revised: 08/02/2011 Document Reviewed: 03/31/2008 [facility] Patient Information 2012 [place], [place].Paracentesis Paracentesis is a procedure used to remove excess fluid from the belly (abdomen). Excess fluid in the belly is called ascites. Excess fluid can be the result of certain conditions, such as infection, inflammation, abdominal injury, heart failure, chronic scarring of the liver (cirrhosis), or cancer. The excess fluid is removed using a needle inserted through  the skin and tissue into the abdomen.  A paracentesis may be done to:  Determine the cause of the excess fluid through examination of the fluid.   Relieve symptoms of shortness of breath or pain caused by the excess fluid.   Determine presence of bleeding after an abdominal injury.  LET YOUR CAREGIVERS KNOW ABOUT:  Allergies.   Medications taken including herbs, eye drops, over-the-counter medications, and creams.   Use of steroids (by mouth or creams).   Previous problems with anesthetics or numbing medicine.   Possibility of pregnancy, if this applies.   History of blood clots (thrombophlebitis).   History of bleeding or blood problems.   Previous surgery.   Other health problems.  RISKS AND COMPLICATIONS  Injury to an abdominal organ, such as the bowel (large intestine), liver, spleen, or bladder.   Possible infection.   Bleeding.   Low blood pressure (hypotension).  BEFORE THE PROCEDURE This is a procedure that can be done as an outpatient. Confirm the time that you need to arrive for your procedure. A blood sample may be done to determine your blood clotting time. The presence of a severe bleeding disorder (coagulopathy) which cannot be promptly corrected may make this procedure inadvisable. You may be asked to urinate. PROCEDURE The procedure will take about 30 minutes. This time will vary depending on the amount of fluid that is removed. You may be asked to lie on your back with your head elevated. An area on your abdomen will be cleansed. A numbing medicine may  then be injected (local anesthesia) into the skin and tissue. A needle is inserted through your abdominal skin and tissues until it is positioned in your abdomen. You may feel pressure or slight pain as the needle is positioned into the abdomen. Fluid is removed from the abdomen through the needle. Tell your caregiver if you feel dizzy or lightheaded. The needle is withdrawn once the desired amount of fluid has  been removed. A sample of the fluid may be sent for examination.  AFTER THE PROCEDURE Your recovery will be assessed and monitored. If there are no problems, as an outpatient, you should be able to go home shortly after the procedure. There may be a very limited amount of clear fluid draining from the needle insertion site over the next 2 days. Confirm with your caregiver as to the expected amount of drainage. Obtaining the Test Results It is your responsibility to obtain your test results. Do not assume everything is normal if you have not heard from your caregiver or the medical facility. It is important for you to follow up on all of your test results. HOME CARE INSTRUCTIONS   You may resume normal diet and activities as directed or allowed.   Only take over-the-counter or prescription medicines for pain, discomfort, or fever as directed by your caregiver.  SEEK IMMEDIATE MEDICAL CARE IF:  You develop shortness of breath or chest pain.   You develop increasing pain, discomfort, or swelling in your abdomen.   You develop new drainage or pus coming from site where fluid was removed.   You develop swelling or increased redness from site where fluid was removed.   You develop an unexplained temperature of 102 F (38.9 C) or above.  Document Released: 02/26/2005 Document Revised: 08/02/2011 Document Reviewed: 04/04/2009 [facility] Patient Information 2012 [place], [place].

## 2011-12-05 NOTE — ED Notes (Signed)
Called [place], reporting will send for pt in 30 mins

## 2011-12-05 NOTE — ED Notes (Signed)
Pt assigned to room in CDU, when returning from [place]. Report given to Marylu Lund, [place]. No belongings left in room to be transferred. [place] called to notify of room to return patient

## 2011-12-05 NOTE — ED Notes (Signed)
Pt reporting hx cirrhosis, on transplant list for liver. Pt reporting distended abdomen worsening over past 2 months which was when he was last drained here at our facility. Pt alert, talking in clear concise sentences.

## 2011-12-05 NOTE — ED Notes (Signed)
Pt talked with Dr Alto Denver on the phone about being discharged and having some of his meds prescribed to him for the rest of the month.

## 2011-12-05 NOTE — ED Notes (Signed)
Reports having abd distention which is causing sob, hx of same. Airway intact at triage, spo2 100%

## 2011-12-05 NOTE — ED Notes (Signed)
Patient transported to Ultrasound 

## 2011-12-06 ENCOUNTER — Telehealth (HOSPITAL_COMMUNITY): Payer: Self-pay

## 2011-12-06 NOTE — Procedures (Signed)
Paracentesis performed yielding 6 L yellow serous fluid.  Patient tolerated well. No immediate complications.  Fluid not sent

## 2011-12-21 ENCOUNTER — Encounter: Payer: Self-pay | Admitting: Internal Medicine

## 2011-12-25 ENCOUNTER — Other Ambulatory Visit (INDEPENDENT_AMBULATORY_CARE_PROVIDER_SITE_OTHER): Payer: Medicaid Other

## 2011-12-25 ENCOUNTER — Encounter: Payer: Self-pay | Admitting: Gastroenterology

## 2011-12-25 ENCOUNTER — Ambulatory Visit (INDEPENDENT_AMBULATORY_CARE_PROVIDER_SITE_OTHER): Payer: Medicaid Other | Admitting: Gastroenterology

## 2011-12-25 VITALS — BP 120/60 | HR 88 | Ht 69.0 in | Wt 182.0 lb

## 2011-12-25 DIAGNOSIS — K7460 Unspecified cirrhosis of liver: Secondary | ICD-10-CM

## 2011-12-25 LAB — CBC WITH DIFFERENTIAL/PLATELET
Basophils Absolute: 0 K/uL (ref 0.0–0.1)
Basophils Relative: 0.2 % (ref 0.0–3.0)
Eosinophils Absolute: 0.2 K/uL (ref 0.0–0.7)
Eosinophils Relative: 3.5 % (ref 0.0–5.0)
HCT: 23.3 % — CL (ref 39.0–52.0)
Hemoglobin: 7.6 g/dL — CL (ref 13.0–17.0)
Lymphocytes Relative: 8.6 % — ABNORMAL LOW (ref 12.0–46.0)
Lymphs Abs: 0.4 K/uL — ABNORMAL LOW (ref 0.7–4.0)
MCHC: 32.5 g/dL (ref 30.0–36.0)
MCV: 76.1 fl — ABNORMAL LOW (ref 78.0–100.0)
Monocytes Absolute: 0.8 K/uL (ref 0.1–1.0)
Monocytes Relative: 17.5 % — ABNORMAL HIGH (ref 3.0–12.0)
Neutro Abs: 3.4 K/uL (ref 1.4–7.7)
Neutrophils Relative %: 70.2 % (ref 43.0–77.0)
Platelets: 82 K/uL — ABNORMAL LOW (ref 150.0–400.0)
RBC: 3.06 Mil/uL — ABNORMAL LOW (ref 4.22–5.81)
RDW: 21.1 % — ABNORMAL HIGH (ref 11.5–14.6)
WBC: 4.8 K/uL (ref 4.5–10.5)

## 2011-12-25 LAB — COMPREHENSIVE METABOLIC PANEL
AST: 34 U/L (ref 0–37)
Alkaline Phosphatase: 82 U/L (ref 39–117)
BUN: 10 mg/dL (ref 6–23)
Creatinine, Ser: 0.5 mg/dL (ref 0.4–1.5)
Total Bilirubin: 1.1 mg/dL (ref 0.3–1.2)

## 2011-12-25 LAB — FERRITIN: Ferritin: 10.3 ng/mL — ABNORMAL LOW (ref 22.0–322.0)

## 2011-12-25 LAB — IBC PANEL: Saturation Ratios: 3.2 % — ABNORMAL LOW (ref 20.0–50.0)

## 2011-12-25 LAB — AFP TUMOR MARKER: AFP-Tumor Marker: 3.9 ng/mL (ref 0.0–8.0)

## 2011-12-25 MED ORDER — SPIRONOLACTONE 50 MG PO TABS: 50.0000 mg | ORAL_TABLET | Freq: Every day | ORAL | Status: DC

## 2011-12-25 MED ORDER — FUROSEMIDE 20 MG PO TABS: 20.0000 mg | ORAL_TABLET | Freq: Every day | ORAL | Status: DC

## 2011-12-25 NOTE — Patient Instructions (Addendum)
One of your biggest health concerns is your smoking.  This increases your risk for most cancers and serious cardiovascular diseases such as strokes, heart attacks.  You should try your best to stop.  If you need assistance, please contact your PCP or Smoking Cessation Class at [facility] [phone]) or [facility] Quit-Line (1-800-QUIT-NOW). It is important that you have a relatively low salt diet.  High salt diet can cause fluid to accumulate in your legs, abdomen and even around your lungs. You should try to avoid NSAID type over the counter pain medicines as best as possible. Tylenol is safe to take for 'routine' aches and pains, but never take more than 1/2 the dose suggested on the package instructions (never more than 2 grams per day). Completely avoid alcohol. You will be set up for an ultrasound for liver disease, screening for hepatoma. Please arrive at  [facility] on 12/27/11 at 915 am and have nothing to eat or drink after midnight. You will get labs drawn today:  Hepatitis A (IgM and IgG), Hepatitis B surface antigen, Hepatitis B surface antibody, Hepatitis C antibody, total iron, ferritin, TIBC, ANA, AMA, alphafeto protein (AFP), anti smooth muscle antibody, alpha 1 antitrypsin, cerulloplasm, CBC, CMET, INR. New prescriptions written for lasix and aldactone (low dose). These have been sent to your pharmacy electronically. BMET in 2 weeks to check electrolytes.  01/08/12 [place] lab basement level 730 am- 530 pm.  No appointment needed. Return to see Dr. Christella Hartigan in 3 weeks. Please make this appointment at the front desk as you leave today. You will be set up for an upper endoscopy (WL with propofol) for recent GI bleeding, screening for varices.  Instructions have been provided to you. Dr. Christella Hartigan will not be prescribing narcotic pain medicines or benzodiazepimes.

## 2011-12-25 NOTE — Progress Notes (Signed)
HPI: This is a   51 year old man whom I am meeting for the first time today.  He was recently admitted to the intensive care unit at [facility] for melena, shock, hemoglobin 3.3. He was transfused 5 units of blood and then he signed out AGAINST MEDICAL ADVICE.  He has seen no overt bleeding since then.  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    He has cirrhosis, first told this last summer in [place] in [place] (he is not sure how they diagnosed)  He drank heavily for 30 years, then completely stopped last labor day.  His brother died of hepatitis C (was in Electronics engineer).    He is living with his mother, has custody of 61 year old daughter.  He told me he made up doses of diuretics, that he was not taking, to the ER MD back in January. He had terrible seizures from these.  He explains to me that he saw many different providers while in [place]. There was no point person for his liver disease.  Today he told me he took his last morphine pill, he is running low on his Ambien, and he wants a paracentesis. I explained to them that I would do n none of those  Review of systems: Pertinent positive and negative review of systems were noted in the above HPI section. Complete review of systems was performed and was otherwise normal.    Past Medical History  Diagnosis Date  . Cirrhosis of liver   . Ascitic fluid   . Alcoholism   . Anxiety   . History of stomach ulcers     Past Surgical History  Procedure Date  . Hernia repair   . Appendectomy     Current Outpatient Prescriptions  Medication Sig Dispense Refill  . Multiple Vitamin (MULITIVITAMIN WITH MINERALS) TABS Take 1 tablet by mouth daily.      . Omeprazole Magnesium 20.6 (20 BASE) MG CPDR Take 1 capsule by mouth daily.      . furosemide (LASIX) 80 MG tablet Take 1 tablet (80 mg total) by mouth 2 (two) times daily.  60 tablet  0  . morphine (ORAMORPH SR) 15 MG 12 hr tablet Take 1 tablet (15 mg total)  by mouth daily.  30 tablet  0  . potassium chloride SA (K-DUR,KLOR-CON) 20 MEQ tablet Take 20 mEq by mouth daily.      Marland Kitchen spironolactone (ALDACTONE) 100 MG tablet Take 100 mg by mouth 4 (four) times daily.      Marland Kitchen zolpidem (AMBIEN) 10 MG tablet Take 0.5 tablets (5 mg total) by mouth at bedtime as needed for sleep.  30 tablet  0    Allergies as of 12/25/2011 - Review Complete 12/25/2011  Allergen Reaction Noted  . Nsaids  12/05/2011    Family History  Problem Relation Age of Onset  . Colon cancer Neg Hx   . Liver disease Brother     Hep C-Blood Transfusion     History   Social History  . Marital Status: Single    Spouse Name: N/A    Number of Children: 1  . Years of Education: N/A   Occupational History  . Unemployed     Social History Main Topics  . Smoking status: Current Everyday Smoker -- 0.5 packs/day  . Smokeless tobacco: Never Used  . Alcohol Use: No  . Drug Use: No  . Sexually Active: Not on file   Other Topics  Concern  . Not on file   Social History Narrative   Daily caffeine        Physical Exam: Ht 5\' 9"  (1.753 m)  Wt 182 lb (82.555 kg)  BMI 26.88 kg/m2 Constitutional: Chronically ill-appearing Psychiatric: alert and oriented x3 Eyes: Mild scleral icterus Mouth: oral pharynx moist, no lesions Neck: supple no lymphadenopathy Cardiovascular: heart regular rate and rhythm Lungs: clear to auscultation bilaterally Abdomen: Protuberant, obvious large amount of ascites, nontender Extremities: 1-2+ pitting edema bilaterally Skin: no lesions on visible extremities    Assessment and plan: 51 y.o. male with  cirrhosis, clearly documented medical noncompliance  He presented to [facility] with an overt GI bleed, hemoglobin of 3.3 and after he received paracentesis and 5 unit blood transfusion he left AGAINST MEDICAL ADVICE. I explained to him very clearly that he has serious liver disease and he could die from it quite easily. Today he asked for refills of  narcotic pain medicines, sleep medicines and he wanted a paracentesis. Told him I would do none of those. He clearly does have ascites. I'm going to start him on low-dose diuretics. Of interesting note he lives to the emergency room physician about being on Lasix and Aldactone and made up doses based on his reading of Internet cases. I am going to start him on 50 of Aldactone and 20 of Lasix. Going to get a battery of blood tests today to check for other causes of cirrhosis outside from his obvious alcoholism. He is going to get an ultrasound. He needs an EGD given his recent GI bleeding, screen for varices.  The interaction with him was quite unusual. I'm not sure he really understands the gravity of his situation. He had many excuses for why he left the hospital, why he has not had care for his liver, why he made a doses of water pills and lied about them in the emergency room.   He will need to discuss narcotic pain medicines and sleep aids with his primary care physician.

## 2011-12-26 ENCOUNTER — Telehealth: Payer: Self-pay

## 2011-12-26 ENCOUNTER — Other Ambulatory Visit: Payer: Self-pay

## 2011-12-26 LAB — CERULOPLASMIN: Ceruloplasmin: 35 mg/dL (ref 20–60)

## 2011-12-26 LAB — HEPATITIS B SURFACE ANTIBODY,QUALITATIVE: Hep B S Ab: NEGATIVE

## 2011-12-26 LAB — MITOCHONDRIAL ANTIBODIES: Mitochondrial M2 Ab, IgG: 0.81 (ref <0.91)

## 2011-12-26 LAB — HEPATITIS A ANTIBODY, TOTAL: Hep A Total Ab: POSITIVE — AB

## 2011-12-26 NOTE — Telephone Encounter (Signed)
I recommend that he do it sooner if we can find a spot, but if he wants to wait until the 23rd that is ok.  If he has any sign of rebleeding (melena, hematemesis) he needs to call or go to ER.

## 2011-12-26 NOTE — Telephone Encounter (Signed)
The pt can not come in tomorrow and the hospital has no openings for Friday.  The pt prefers to leave the appointment on for 01/17/12.  I advised him I would talk with Dr Christella Hartigan tomorrow about leaving the appointment as is or trying to get him in next week per Dr Christella Hartigan verbal recommendation

## 2011-12-26 NOTE — Telephone Encounter (Signed)
Message copied by Donata Duff on Wed Dec 26, 2011  8:12 AM ------      Message from: Rob Bunting P      Created: Wed Dec 26, 2011  7:28 AM       I thought more about [facility] Watson and would prefer to do his EGD sooner.  There is a good chance he has varices, could have bled from them 3 weeks ago. HE signed out AMA prior to any EGD.  No signs of recurrent bleeding.            Can you try to move him up to EGD tomorrow (propofol preferable at [facility]) or a propofol lunch time case later this week (usually first Friday of the month has propofol) or next.  Thanks

## 2011-12-26 NOTE — Telephone Encounter (Signed)
Left message on machine to call back  

## 2011-12-27 ENCOUNTER — Telehealth: Payer: Self-pay | Admitting: Gastroenterology

## 2011-12-27 ENCOUNTER — Inpatient Hospital Stay (HOSPITAL_COMMUNITY): Admission: RE | Admit: 2011-12-27 | Payer: Medicaid Other | Source: Ambulatory Visit

## 2011-12-27 ENCOUNTER — Encounter (HOSPITAL_COMMUNITY): Payer: Self-pay | Admitting: Emergency Medicine

## 2011-12-27 ENCOUNTER — Other Ambulatory Visit (HOSPITAL_COMMUNITY): Payer: Medicaid Other

## 2011-12-27 ENCOUNTER — Ambulatory Visit (HOSPITAL_COMMUNITY)
Admission: EM | Admit: 2011-12-27 | Discharge: 2011-12-27 | Disposition: A | Discharge status: Home / Self Care | Payer: Medicaid Other | Attending: Emergency Medicine | Admitting: Emergency Medicine

## 2011-12-27 ENCOUNTER — Emergency Department (HOSPITAL_COMMUNITY): Payer: Medicaid Other

## 2011-12-27 DIAGNOSIS — R609 Edema, unspecified: Secondary | ICD-10-CM | POA: Insufficient documentation

## 2011-12-27 DIAGNOSIS — Z91199 Patient's noncompliance with other medical treatment and regimen due to unspecified reason: Secondary | ICD-10-CM | POA: Insufficient documentation

## 2011-12-27 DIAGNOSIS — R188 Other ascites: Secondary | ICD-10-CM | POA: Insufficient documentation

## 2011-12-27 DIAGNOSIS — D649 Anemia, unspecified: Secondary | ICD-10-CM | POA: Insufficient documentation

## 2011-12-27 DIAGNOSIS — K7460 Unspecified cirrhosis of liver: Secondary | ICD-10-CM | POA: Insufficient documentation

## 2011-12-27 DIAGNOSIS — Z9119 Patient's noncompliance with other medical treatment and regimen: Secondary | ICD-10-CM | POA: Insufficient documentation

## 2011-12-27 LAB — COMPREHENSIVE METABOLIC PANEL
ALT: 16 U/L (ref 0–53)
Alkaline Phosphatase: 84 U/L (ref 39–117)
BUN: 9 mg/dL (ref 6–23)
CO2: 22 mEq/L (ref 19–32)
Chloride: 98 mEq/L (ref 96–112)
GFR calc Af Amer: >90 mL/min (ref >90)
GFR calc non Af Amer: >90 mL/min (ref >90)
Glucose, Bld: 132 mg/dL — ABNORMAL HIGH (ref 70–99)
Potassium: 3.4 mEq/L — ABNORMAL LOW (ref 3.5–5.1)
Total Bilirubin: 1.2 mg/dL (ref 0.3–1.2)

## 2011-12-27 LAB — CBC
Hemoglobin: 7.1 g/dL — ABNORMAL LOW (ref 13.0–17.0)
MCHC: 31.3 g/dL (ref 30.0–36.0)
RDW: 18.8 % — ABNORMAL HIGH (ref 11.5–15.5)

## 2011-12-27 LAB — DIFFERENTIAL
Eosinophils Absolute: 0.1 10*3/uL (ref 0.0–0.7)
Lymphocytes Relative: 18 % (ref 12–46)
Lymphs Abs: 0.8 10*3/uL (ref 0.7–4.0)
Monocytes Relative: 4 % (ref 3–12)
Neutro Abs: 3.4 10*3/uL (ref 1.7–7.7)
Neutrophils Relative %: 76 % (ref 43–77)

## 2011-12-27 LAB — PROTIME-INR: Prothrombin Time: 19.2 seconds — ABNORMAL HIGH (ref 11.6–15.2)

## 2011-12-27 NOTE — ED Provider Notes (Signed)
History     CSN: [id number]  Arrival date & time 12/27/11  4098   First MD Initiated Contact with Patient 12/27/11 0827      Chief Complaint  Patient presents with  . Abdominal Pain    (Consider location/radiation/quality/duration/timing/severity/associated sxs/prior treatment) HPI  H/o cirrhosis, etoh abuse presents with abdominal tightness and concern regarding his ascites. The patient states that his symptoms have become gradually worse and his last paracentesis a few weeks ago. This is out of the emergency department. The patient states it is becoming difficult for him to breathe injury to his abdominal distention. He complains of abdominal tightness without abdominal pain. Denies fevers, chills. Denies nausea, vomiting, constipation. He denies chest pain, shortness of breath. He states that he has been taking his diuretics. He states that he would like to make an appointment with hepatology at [facility] but has not done so. he was recently seen by Dr. Christella Hartigan of gastroenterology requesting narcotic pain medication as well as a paracentesis. He was prescribed Lasix at 80 mg bid and Aldactone at 200 mg bid. He was scheduled for an endoscopy to evaluate for esophageal varices as well as an ultrasound of his abdomen which was to be done today at [facility]. The patient came to the emergency department at [facility] instead. He does have a h/o significant bleeding after procedures, but tolerated last US paracentesis without difficulty.  ED Notes, ED Provider Notes from 12/27/11 0000 to 12/27/11 08:23:48       Mittie Bodo, RN 12/27/2011 08:23      Pt sts new liver doctor told him to not take his fluid pills x 2 weeks; pt sts saw him yesterday; pt sts abd distention and pain from tightness; pt sts here to get abd fluid drained; pt sts mild swelling in legs; pt with hx of cirrhosis      Past Medical History  Diagnosis Date  . Cirrhosis of liver   . Ascitic fluid   . Alcoholism   . Anxiety     . History of stomach ulcers     Past Surgical History  Procedure Date  . Hernia repair   . Appendectomy     Family History  Problem Relation Age of Onset  . Colon cancer Neg Hx   . Liver disease Brother     Hep C-Blood Transfusion     History  Substance Use Topics  . Smoking status: Current Everyday Smoker -- 0.5 packs/day  . Smokeless tobacco: Never Used  . Alcohol Use: No      Review of Systems  All other systems reviewed and are negative.   except as noted HPI   Allergies  Nsaids  Home Medications   Current Outpatient Rx  Name Route Sig Dispense Refill  . ADULT MULTIVITAMIN W/MINERALS CH Oral Take 1 tablet by mouth daily.    Marland Kitchen OMEPRAZOLE MAGNESIUM 20.6 (20 BASE) MG PO CPDR Oral Take 1 capsule by mouth daily.    Marland Kitchen ZOLPIDEM TARTRATE 10 MG PO TABS Oral Take 5 mg by mouth at bedtime as needed.    . FUROSEMIDE 20 MG PO TABS Oral Take 80 mg by mouth 2 (two) times daily.    Marland Kitchen SPIRONOLACTONE 50 MG PO TABS Oral Take 200 mg by mouth 2 (two) times daily.      BP 101/64  Pulse 88  Temp(Src) 98.3 F (36.8 C) (Oral)  Resp 20  SpO2 100%  Physical Exam  Nursing note and vitals reviewed. Constitutional: He is  oriented to person, place, and time. He appears well-developed and well-nourished. No distress.  HENT:  Head: Atraumatic.  Mouth/Throat: Oropharynx is clear and moist.  Eyes: Conjunctivae are normal. Pupils are equal, round, and reactive to light.  Neck: Neck supple.  Cardiovascular: Normal rate, regular rhythm, normal heart sounds and intact distal pulses.  Exam reveals no gallop and no friction rub.   No murmur heard. Pulmonary/Chest: Effort normal. No respiratory distress. He has no wheezes. He has no rales.  Abdominal: Soft. Bowel sounds are normal. He exhibits distension. There is no tenderness. There is no rebound and no guarding.       +fluid wave  Musculoskeletal: Normal range of motion. He exhibits edema. He exhibits no tenderness.       B/l 1+  pitting edema LE  Neurological: He is alert and oriented to person, place, and time.  Skin: Skin is warm and dry.  Psychiatric: He has a normal mood and affect.    ED Course  Procedures (including critical care time)  Labs Reviewed  CBC - Abnormal; Notable for the following:    RBC 3.03 (*)    Hemoglobin 7.1 (*)    HCT 22.7 (*)    MCV 74.9 (*)    MCH 23.4 (*)    RDW 18.8 (*)    Platelets 63 (*) PLATELET COUNT CONFIRMED BY SMEAR   All other components within normal limits  COMPREHENSIVE METABOLIC PANEL - Abnormal; Notable for the following:    Sodium 129 (*)    Potassium 3.4 (*)    Glucose, Bld 132 (*)    Calcium 8.1 (*)    Total Protein 5.6 (*)    Albumin 2.4 (*)    All other components within normal limits  PROTIME-INR - Abnormal; Notable for the following:    Prothrombin Time 19.2 (*)    INR 1.58 (*)    All other components within normal limits  DIFFERENTIAL  APTT   No results found.   1. Ascites   2. Cirrhosis   3. Medically noncompliant   4. Anemia     MDM  Liver cirrhosis with recurrent ascites. No suspicion for SBP. Patient sent over to radiology for symptomatic paracentesis. Personally 6 L of fluid was drained off without apparent complication. No active bleeding or pain at site of drainage. Discussed with patient his chronic hyponatremia as well as chronic anemia and thrombocytopenia. Patient is refusing further workup and evaluation in the emergency department. He is refusing blood transfusion. He states that he will follow with GI and has plans to follow up with his primary care doctor and [facility] as well. No EMC precluding discharge at this time. Given Precautions for return. PMD f/u.         Forbes Cellar, MD 12/27/11 [phone]

## 2011-12-27 NOTE — Telephone Encounter (Signed)
FYI Dr Jacobs  

## 2011-12-27 NOTE — ED Notes (Signed)
Pt here for paracentesis pt here often for same,sts he has not had his fluid for several weeks per direction from his doctor. Pt sts he does not want any other things done, requesting no iv, informed if labs come back normal i will remove iv. Pt denies any other symptoms.

## 2011-12-27 NOTE — Discharge Instructions (Signed)
Cirrhosis Cirrhosis is a condition of scarring of the liver which is caused when the liver has tried repairing itself following damage. This damage may come from a previous infection such as one of the forms of hepatitis (usually hepatitis C), or the damage may come from being injured by toxins. The main toxin that causes this damage is alcohol. The scarring of the liver from use of alcohol is irreversible. That means the liver cannot return to normal even though alcohol is not used any more. The main danger of hepatitis C infection is that it may cause long-lasting (chronic) liver disease, and this also may lead to cirrhosis. This complication is progressive and irreversible. CAUSES  Prior to available blood tests, hepatitis C could be contracted by blood transfusions. Since testing of blood has improved, this is now unlikely. This infection can also be contracted through intravenous drug use and the sharing of needles. It can also be contracted through sexual relationships. The injury caused by alcohol comes from too much use. It is not a few drinks that poison the liver, but years of misuse. Usually there will be some signs and symptoms early with scarring of the liver that suggest the development of better habits. Alcohol should never be used while using acetaminophen. A small dose of both taken together may cause irreversible damage to the liver. HOME CARE INSTRUCTIONS  There is no specific treatment for cirrhosis. However, there are things you can do to avoid making the condition worse.  Rest as needed.   Eat a well-balanced diet. Your caregiver can help you with suggestions.   Vitamin supplements including vitamins A, K, D, and thiamine can help.   A low-salt diet, water restriction, or diuretic medicine may be needed to reduce fluid retention.   Avoid alcohol. This can be extremely toxic if combined with acetaminophen.   Avoid drugs which are toxic to the liver. Some of these include  isoniazid, methyldopa, acetaminophen, anabolic steroids (muscle-building drugs), erythromycin, and oral contraceptives (birth control pills). Check with your caregiver to make sure medicines you are presently taking will not be harmful.   Periodic blood tests may be required. Follow your caregiver's advice regarding the timing of these.   Milk thistle is an herbal remedy which does protect the liver against toxins. However, it will not help once the liver has been scarred.  SEEK MEDICAL CARE IF:  You have increasing fatigue or weakness.   You develop swelling of the hands, feet, legs, or face.   You vomit bright red blood, or a coffee ground appearing material.   You have blood in your stools, or the stools turn black and tarry.   You have a fever.   You develop loss of appetite, or have nausea and vomiting.   You develop jaundice.   You develop easy bruising or bleeding.   You have worsening of any of the problems you are concerned about.  Document Released: 08/13/2005 Document Revised: 08/02/2011 Document Reviewed: 03/31/2008 [facility] Patient Information 2012 [place], [place].  Ascites Ascites is a gathering of fluid in the belly (abdomen). This is most often caused by liver disease. It may also be caused by a number of other less common problems. It causes a ballooning out (distension) of the abdomen. CAUSES  Scarring of the liver (cirrhosis) is the most common cause of ascites. Other causes include:  Infection or inflammation in the abdomen.   Cancer in the abdomen.   Heart failure.   Certain forms of kidney failure (nephritic  syndrome).   Inflammation of the pancreas.   Clots in the veins of the liver.  SYMPTOMS  In the early stages of ascites, you may not have any symptoms. The main symptom of ascites is a sense of abdominal bloating. This is due to the presence of fluid. This may also cause an increase in abdominal or waist size. People with this condition can  develop swelling in the legs, and men can develop a swollen scrotum. When there is a lot of fluid, it may be hard to breath. Stretching of the abdomen by fluid can be painful. DIAGNOSIS  Certain features of your medical history, such as a history of liver disease and of an enlarging abdomen, can suggest the presence of ascites. The diagnosis of ascites can be made on physical exam by your caregiver. An abdominal ultrasound examination can confirm that ascites is present, and estimate the amount of fluid. Once ascites is confirmed, it is important to determine its cause. Again, a history of one of the conditions listed in "CAUSES" provides a strong clue. A physical exam is important, and blood and X-ray tests may be needed. During a procedure called paracentesis, a sample of fluid is removed from the abdomen. This can determine certain key features about the fluid, such as whether or not infection or cancer is present. Your caregiver will determine if a paracentesis is necessary. They will describe the procedure to you. PREVENTION  Ascites is a complication of other conditions. Therefore to prevent ascites, you must seek treatment for any significant health conditions you have. Once ascites is present, careful attention to fluid and salt intake may help prevent it from getting worse. If you have ascites, you should not drink alcohol. PROGNOSIS  The prognosis of ascites depends on the underlying disease. If the disease is reversible, such as with certain infections or with heart failure, then ascites may improve or disappear. When ascites is caused by cirrhosis, then it indicates that the liver disease has worsened, and further evaluation and treatment of the liver disease is needed. If your ascites is caused by cancer, then the success or failure of the cancer treatment will determine whether your ascites will improve or worsen. RISKS AND COMPLICATIONS  Ascites is likely to worsen if it is not properly  diagnosed and treated. A large amount of ascites can cause pain and difficulty breathing. The main complication, besides worsening, is infection (called spontaneous bacterial peritonitis). This requires prompt treatment. TREATMENT  The treatment of ascites depends on its cause. When liver disease is your cause, medical management using water pills (diuretics) and decreasing salt intake is often effective. Ascites due to peritoneal inflammation or malignancy (cancer) alone does not respond to salt restriction and diuretics. Hospitalization is sometimes required. If the treatment of ascites cannot be managed with medications, a number of other treatments are available. Your caregivers will help you decide which will work best for you. Some of these are:  Removal of fluid from the abdomen (paracentesis).   Fluid from the abdomen is passed into a vein (peritoneovenous shunting).   Liver transplantation.   Transjugular intrahepatic portosystemic stent shunt.  HOME CARE INSTRUCTIONS  It is important to monitor body weight and the intake and output of fluids. Weigh yourself at the same time every day. Record your weights. Fluid restriction may be necessary. It is also important to know your salt intake. The more salt you take in, the more fluid you will retain. Ninety percent of people with ascites respond to  this approach.  Follow any directions for medicines carefully.   Follow up with your caregiver, as directed.   Report any changes in your health, especially any new or worsening symptoms.   If your ascites is from liver disease, avoid alcohol and other substances toxic to the liver.  SEEK MEDICAL CARE IF:   Your weight increases more than a few pounds in a few days.   Your abdominal or waist size increases.   You develop swelling in your legs.   You had swelling and it worsens.  SEEK IMMEDIATE MEDICAL CARE IF:   You develop a fever.   You develop new abdominal pain.   You develop  difficulty breathing.   You develop confusion.   You have bleeding from the mouth, stomach, or rectum.  MAKE SURE YOU:   Understand these instructions.   Will watch your condition.   Will get help right away if you are not doing well or get worse.  Document Released: 08/13/2005 Document Revised: 08/02/2011 Document Reviewed: 03/14/2007 [facility] Patient Information 2012 [place], [place].  RESOURCE GUIDE  Dental Problems  Patients with Medicaid: [facility] [phone]                                           [phone] [address] Phone:  [phone]                                                   Phone:  [phone]  If unable to pay or uninsured, contact:  [facility] or [facility]. to become qualified for the adult dental clinic.  Chronic Pain Problems Contact [facility]  [phone] Patients need to be referred by their primary care doctor.  Insufficient Money for Medicine Contact [employer] Way:  call "211" or [facility] 5175678440.  No Primary Care Doctor Call [facility]  [phone] Other agencies that provide inexpensive medical care    [facility]  [phone]    [facility]  [phone]    [facility]  [phone]    [facility]  [phone]    [facility]  7798283543    [facility]  [phone]  Psychological Services [facility]  [phone] [facility]  [phone] [facility]   [phone] (emergency services [phone])  Abuse/Neglect [facility] Child Abuse Hotline 908-712-1318 [facility] Child Abuse Hotline 954-454-8056 (After Hours)  Emergency Shelter [facility] [phone]  Maternity Homes [facility] at the [place] [facility] [place] [phone] [facility] [phone]  MRSA Hotline #:   856-862-6897    [facility]  Resources  [facility]  [employer]                           [facility] [address] [place]                     [address]  [address]  [place]                                               [place]                              [place] Phone:  [phone]                                  Phone:  [phone]                   Phone:  [phone]  [facility] Phone:  [phone]  [place] Child Abuse Hotline (336) 342-1394 [phone] (After Hours)  

## 2011-12-27 NOTE — ED Notes (Signed)
Patient transported to Ultrasound 

## 2011-12-27 NOTE — ED Notes (Signed)
Pt returned from ultrasound. Requesting iv be taken out.

## 2011-12-27 NOTE — Telephone Encounter (Signed)
See alternate phone note  

## 2011-12-27 NOTE — ED Notes (Signed)
MD at bedside. 

## 2011-12-27 NOTE — Procedures (Signed)
Procedure : paracentesis Specimen : 6.5 L amber colored fluid Complications : none immediate  Patient tolerated well.  Patient refused US imaging of his abdomen.  Patient returned to ED with stable vitals

## 2011-12-27 NOTE — ED Notes (Signed)
Pt sts new liver doctor told him to not take his fluid pills x 2 weeks; pt sts saw him yesterday; pt sts abd distention and pain from tightness; pt sts here to get abd fluid drained; pt sts mild swelling in legs; pt with hx of cirrhosis

## 2011-12-27 NOTE — ED Notes (Signed)
Pt still in ultrasound.

## 2011-12-27 NOTE — Telephone Encounter (Signed)
Left message on machine to call back  

## 2011-12-28 ENCOUNTER — Ambulatory Visit (HOSPITAL_COMMUNITY): Admission: RE | Admit: 2011-12-28 | Payer: Medicaid Other | Source: Ambulatory Visit | Admitting: Gastroenterology

## 2011-12-28 ENCOUNTER — Encounter (HOSPITAL_COMMUNITY): Admission: RE | Payer: Self-pay | Source: Ambulatory Visit

## 2011-12-28 SURGERY — ESOPHAGOGASTRODUODENOSCOPY (EGD) WITH PROPOFOL
Anesthesia: Monitor Anesthesia Care

## 2011-12-28 NOTE — Telephone Encounter (Signed)
ok 

## 2012-01-15 ENCOUNTER — Ambulatory Visit: Payer: Medicaid Other | Admitting: Gastroenterology

## 2012-01-17 ENCOUNTER — Encounter (HOSPITAL_COMMUNITY): Admission: RE | Payer: Self-pay | Source: Ambulatory Visit

## 2012-01-17 ENCOUNTER — Ambulatory Visit (HOSPITAL_COMMUNITY): Admission: RE | Admit: 2012-01-17 | Payer: Medicaid Other | Source: Ambulatory Visit | Admitting: Gastroenterology

## 2012-01-17 SURGERY — ESOPHAGOGASTRODUODENOSCOPY (EGD) WITH PROPOFOL
Anesthesia: Monitor Anesthesia Care

## 2012-10-21 ENCOUNTER — Telehealth: Payer: Self-pay | Admitting: Gastroenterology

## 2012-10-21 NOTE — Telephone Encounter (Signed)
Please remind him of last years visit, it was a strange encounter and he cancelled all future visits, procedures with me.  I don't know what surgery he is planning to have at [facility]. If he wishes to come in to discuss I am happy to see him but will not schedule direct procedures without office visit.

## 2012-10-21 NOTE — Telephone Encounter (Signed)
Dr Christella Hartigan per the pt he cx all procedures and office visits with you because "it was not a good fit" should I call him and tell him he in no longer a pt of yours?

## 2012-10-22 NOTE — Telephone Encounter (Signed)
No, please cancel that appointment and begin discharge procedure for patient.  The doctor patient relationship is clearly broken here (he called me "arrogant" and believes I am disregarding tests and giving him the wrong advice.

## 2012-10-22 NOTE — Telephone Encounter (Signed)
Letter printed and sent to Eugene Garnet to send to medical records to mail

## 2012-10-22 NOTE — Telephone Encounter (Signed)
Pt wanted to make the appt with Dr Christella Hartigan to discuss the procedures, he continued to say that Dr Christella Hartigan was arrogant and disregarded lab results and gave him the wrong advice.  I asked the pt numerous times was he sure Dr Christella Hartigan was the MD he wanted to see if he had no faith in him.  He states that [facility] the only office that takes medicaid and he has to see someone.  Do you want to leave him on your schedule for 11/11/12?

## 2012-10-22 NOTE — Telephone Encounter (Signed)
Dr Christella Hartigan I sent the letter to discharge the pt to your desktop.  I also put the discharge form on your desk

## 2012-10-23 ENCOUNTER — Telehealth: Payer: Self-pay | Admitting: Gastroenterology

## 2012-10-23 NOTE — Telephone Encounter (Signed)
Dismissal Letter sent by Certified Mail 10/23/2012  Received the Return Receipt showing someone picked up the Dismissal Letter 10/29/2012

## 2012-10-30 ENCOUNTER — Encounter: Payer: Self-pay | Admitting: Gastroenterology

## 2012-11-11 ENCOUNTER — Ambulatory Visit: Payer: Medicaid Other | Admitting: Gastroenterology

## 2013-01-04 IMAGING — US US PARACENTESIS
1 series · 8 of 8 positions shown · non-contrast
Comparison: Prior paracentesis procedure.

CLINICAL DATA: Recurrent symptomatic ascites.  Request from the ED
for large volume paracentesis for comfort measures.

ULTRASOUND GUIDED PARACENTESIS

[Series 1: us paracentesis · 0.32mm/px · 8 of 8 slices shown]
[im 1/8]
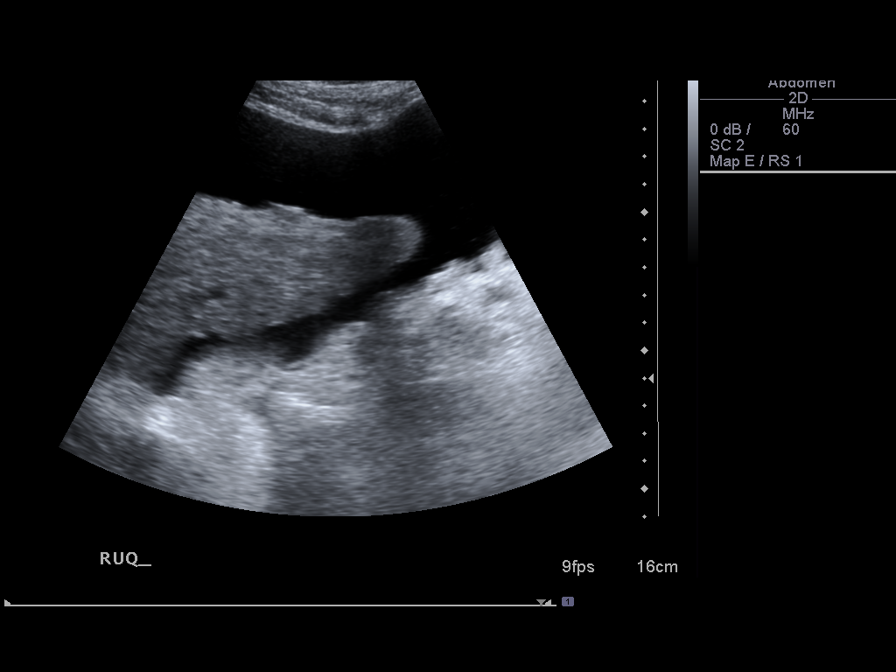
[im 2/8]
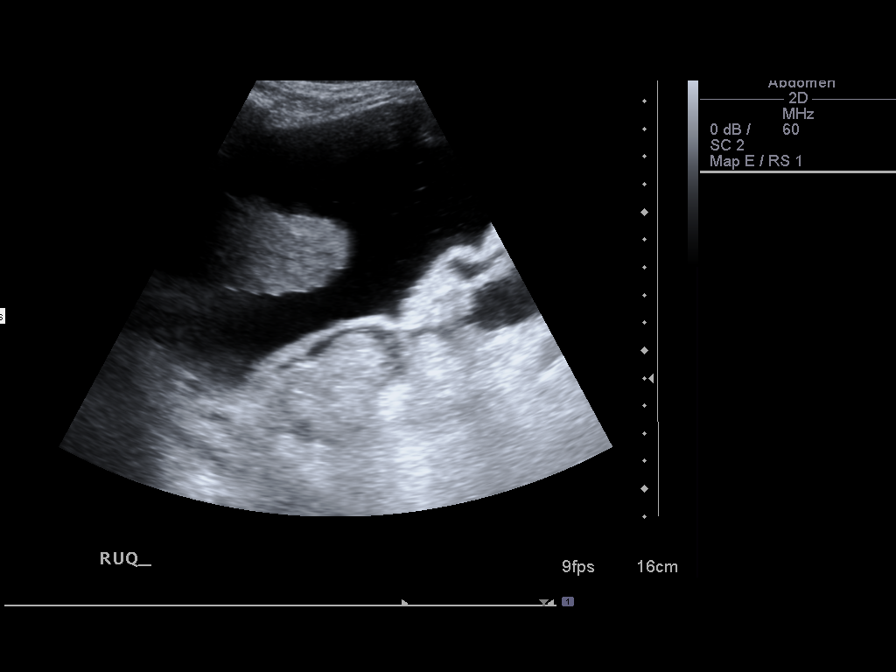
[im 3/8]
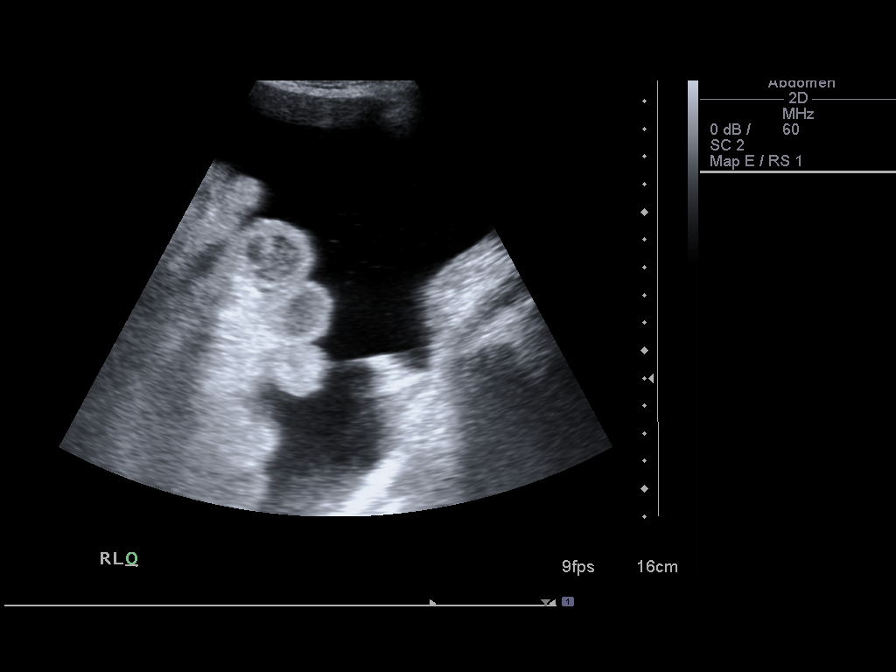
[im 4/8]
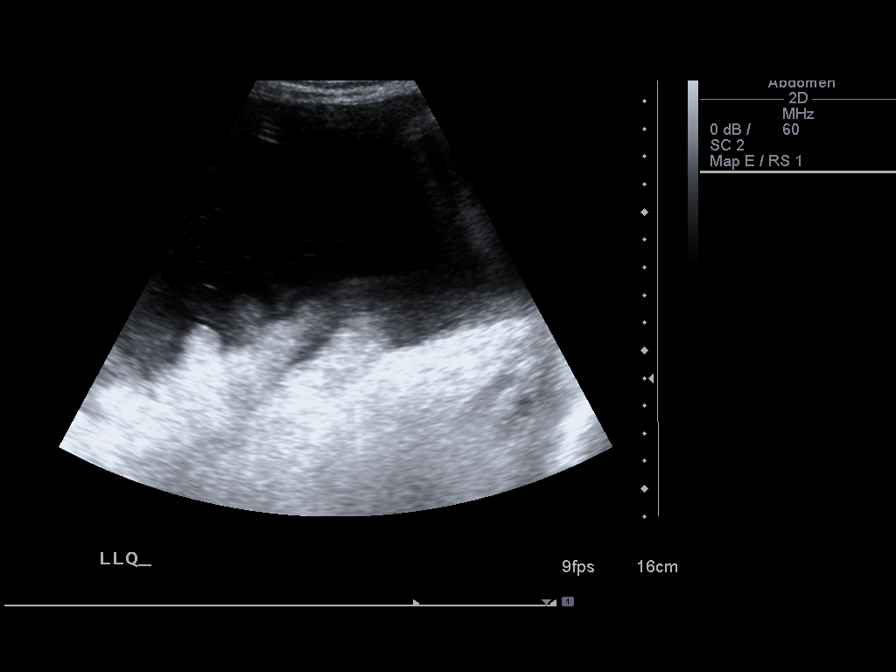
[im 5/8]
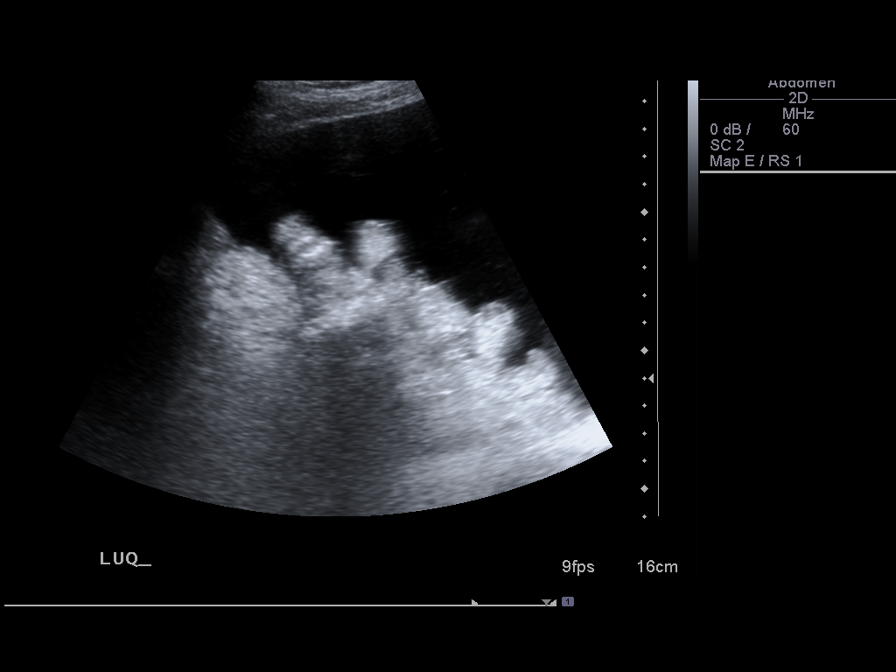
[im 6/8]
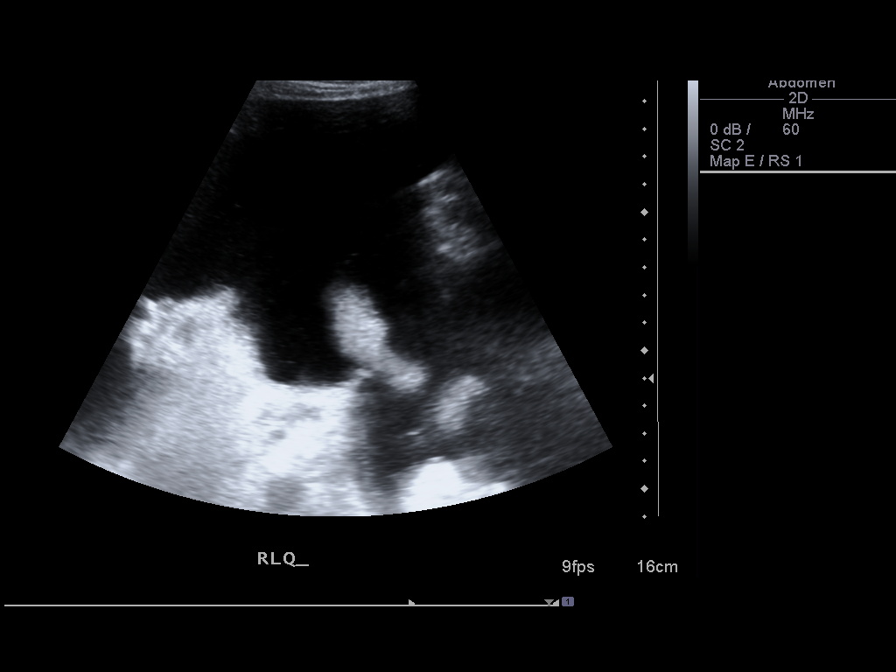
[im 7/8]
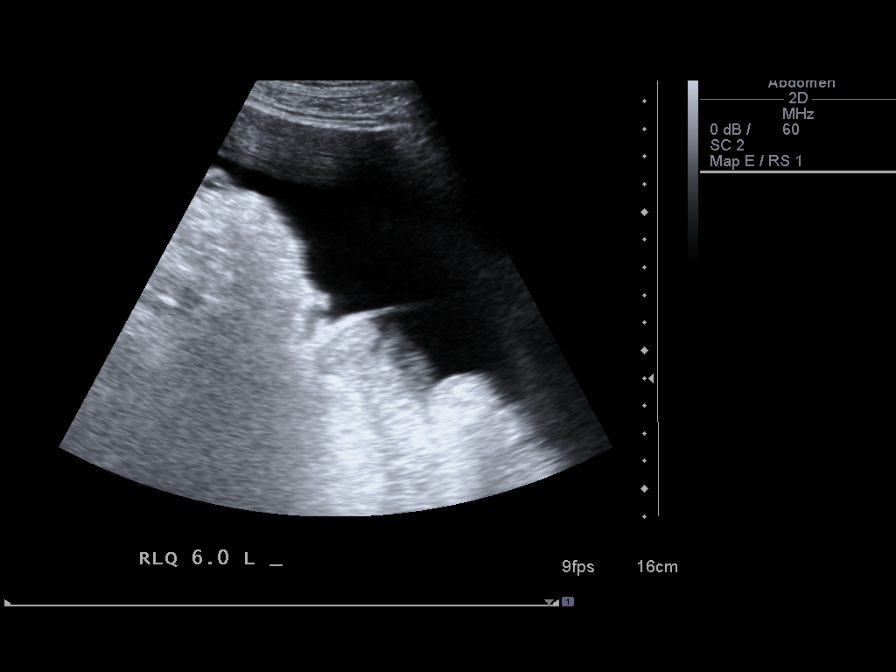
[im 8/8]
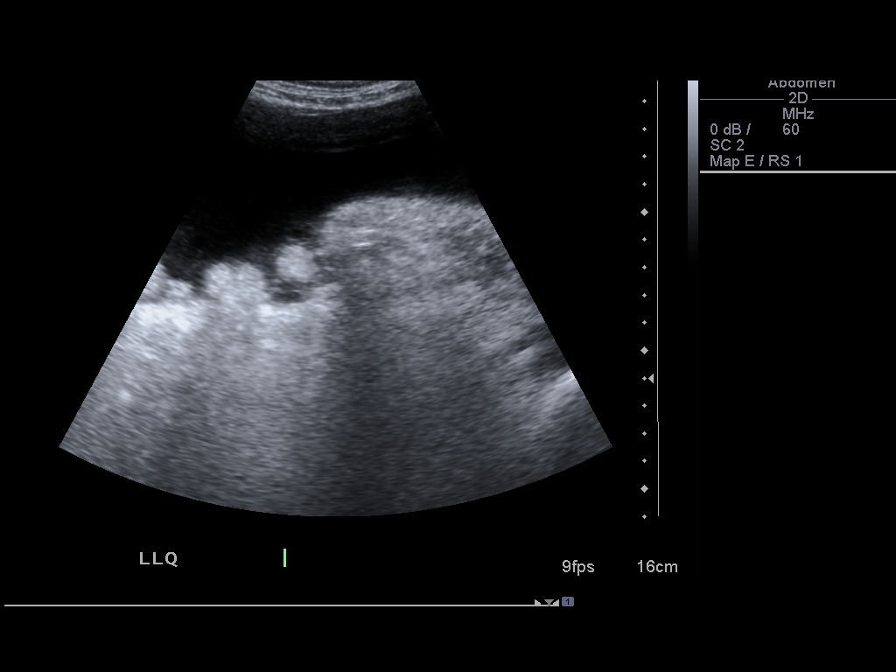

[8 of 8 positions shown; findings below may reference images not displayed]

An ultrasound guided paracentesis was thoroughly discussed with the
patient and questions answered.  The benefits, risks, alternatives
and complications were also discussed.  The patient understands and
wishes to proceed with the procedure.  Written consent was
obtained.

Ultrasound was performed to localize and mark an adequate pocket of
fluid in the right lower quadrant of the abdomen.  The area was
then prepped and draped in the normal sterile fashion.  1%
Lidocaine was used for local anesthesia.  Under ultrasound guidance
a 19 gauge Yueh catheter was introduced.  Paracentesis was
performed.  The catheter was removed and a dressing applied.

Complications:  None immediate
FINDINGS: A total of approximately 6 liters of yellow serous fluid
was removed.  A fluid sample was not sent for laboratory analysis.
IMPRESSION: Successful ultrasound guided paracentesis yielding 6 liters of
ascites.

Read by: Furst, Kimiko.-MCCRANEY

## 2013-01-26 IMAGING — US US PARACENTESIS
1 series · 5 of 5 positions shown · non-contrast
Comparison: Prior paracentesis procedure.

CLINICAL DATA: Liver disease with recurrent symptomatic ascites.
Request has been made for large volume paracentesis.

ULTRASOUND GUIDED PARACENTESIS

[Series 1: us paracentesis · 0.31mm/px · 5 of 5 slices shown]
[im 1/5]
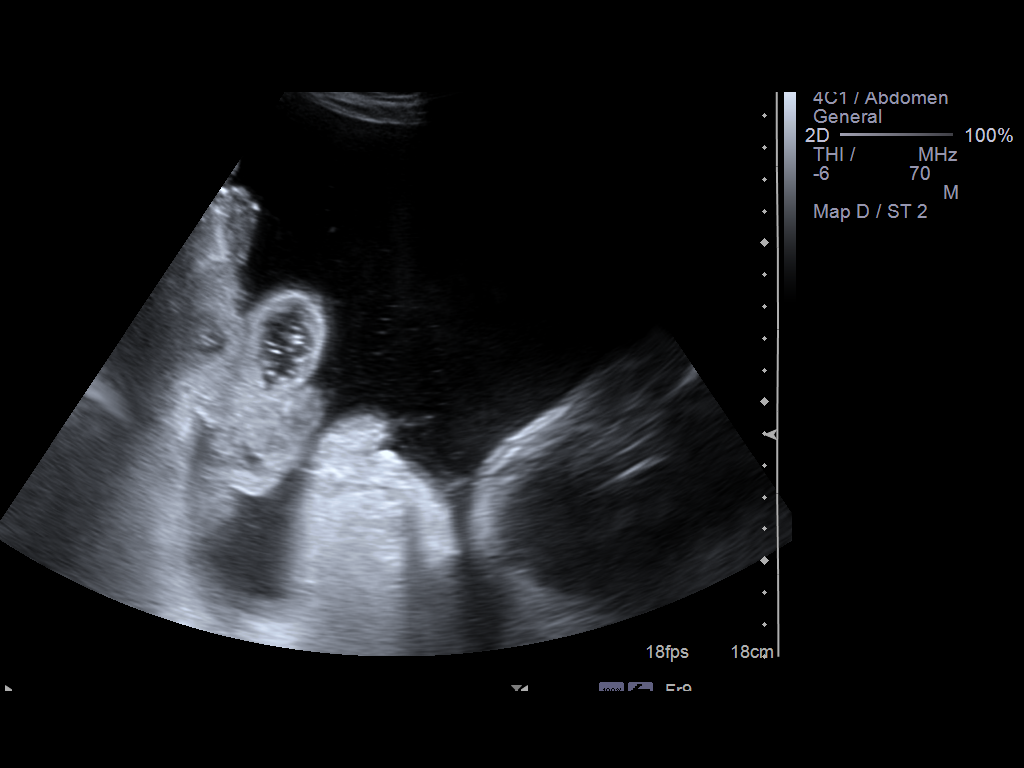
[im 2/5]
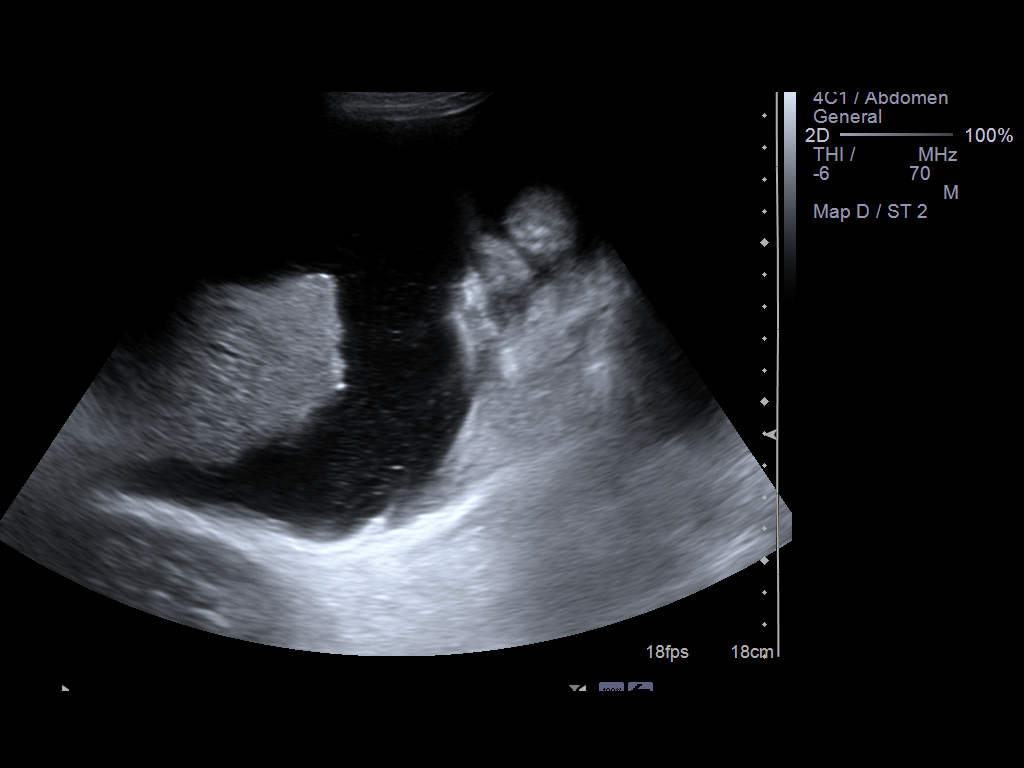
[im 3/5]
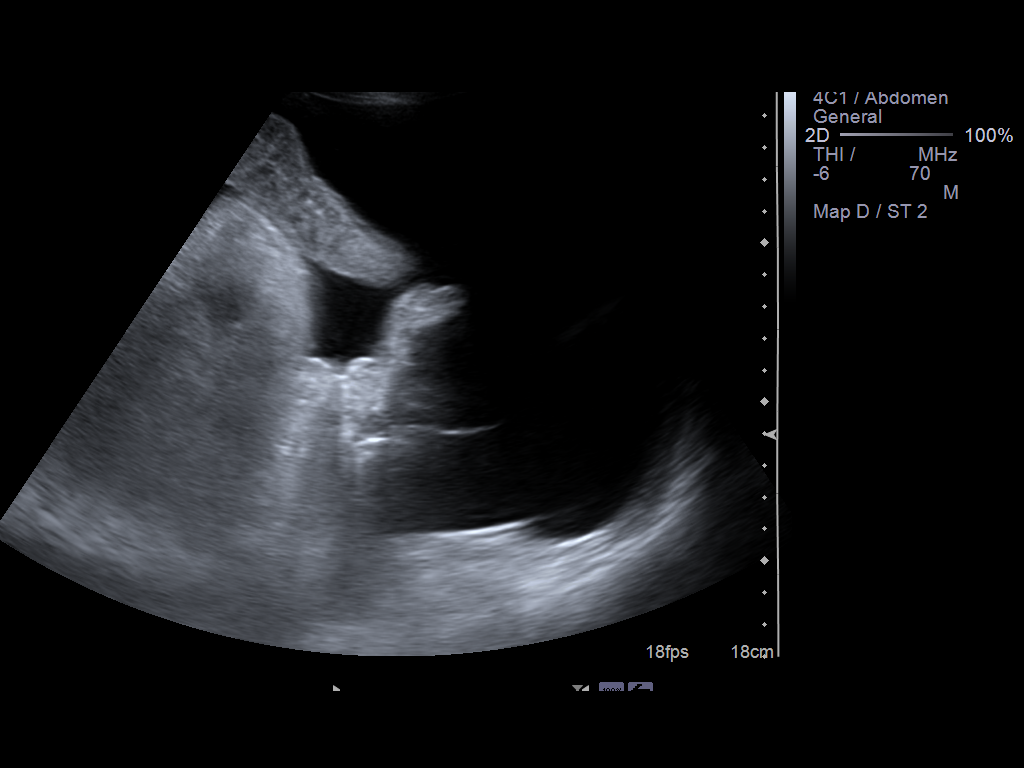
[im 4/5]
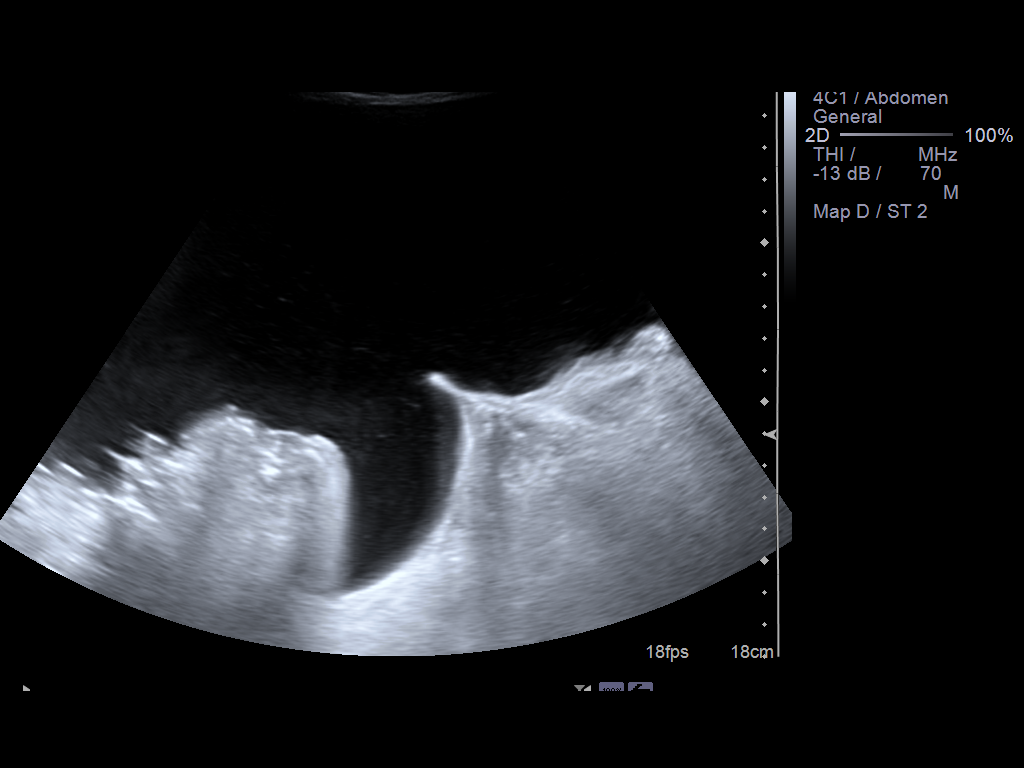
[im 5/5]
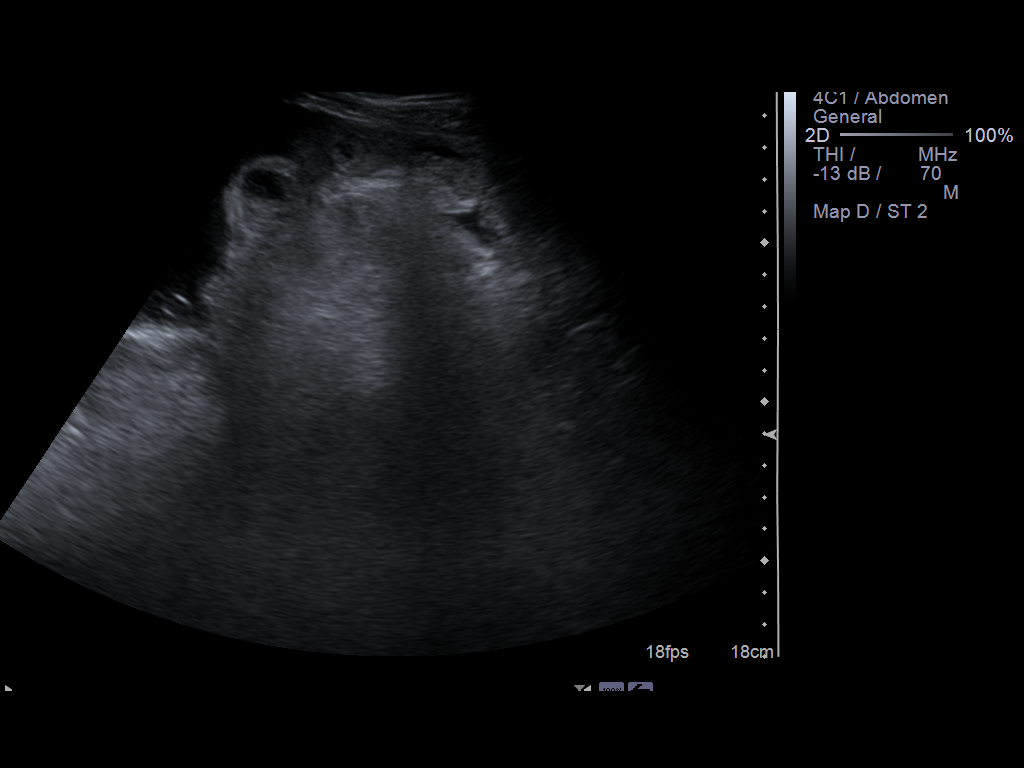

[5 of 5 positions shown; findings below may reference images not displayed]

An ultrasound guided paracentesis was thoroughly discussed with the
patient and questions answered.  The benefits, risks, alternatives
and complications were also discussed.  The patient understands and
wishes to proceed with the procedure.  Written consent was
obtained.

Ultrasound was performed to localize and mark an adequate pocket of
fluid in the right lower quadrant of the abdomen.  The area was
then prepped and draped in the normal sterile fashion.  1%
Lidocaine was used for local anesthesia.  Under ultrasound guidance
a 19 gauge Yueh catheter was introduced.  Paracentesis was
performed.  The catheter was removed and a dressing applied.

Complications:  None immediate
FINDINGS: A total of approximately 6.5 liters of amber-colored
fluid was removed.  A fluid sample was not  sent for laboratory
analysis.
IMPRESSION: Successful ultrasound guided paracentesis yielding 6.5 liters of
ascites.

Read by: Humayoon, Muhammad Agha.-DIIDE

## 2013-03-03 ENCOUNTER — Emergency Department (HOSPITAL_COMMUNITY)
Admission: EM | Admit: 2013-03-03 | Discharge: 2013-03-03 | Disposition: A | Discharge status: Home / Self Care | Payer: Medicaid Other | Attending: Emergency Medicine | Admitting: Emergency Medicine

## 2013-03-03 ENCOUNTER — Encounter (HOSPITAL_COMMUNITY): Payer: Self-pay | Admitting: *Deleted

## 2013-03-03 DIAGNOSIS — G4700 Insomnia, unspecified: Secondary | ICD-10-CM | POA: Insufficient documentation

## 2013-03-03 DIAGNOSIS — R188 Other ascites: Secondary | ICD-10-CM | POA: Insufficient documentation

## 2013-03-03 DIAGNOSIS — Z760 Encounter for issue of repeat prescription: Secondary | ICD-10-CM | POA: Insufficient documentation

## 2013-03-03 DIAGNOSIS — Z8719 Personal history of other diseases of the digestive system: Secondary | ICD-10-CM | POA: Insufficient documentation

## 2013-03-03 DIAGNOSIS — K7460 Unspecified cirrhosis of liver: Secondary | ICD-10-CM | POA: Insufficient documentation

## 2013-03-03 DIAGNOSIS — Z79899 Other long term (current) drug therapy: Secondary | ICD-10-CM | POA: Insufficient documentation

## 2013-03-03 DIAGNOSIS — Z87891 Personal history of nicotine dependence: Secondary | ICD-10-CM | POA: Insufficient documentation

## 2013-03-03 DIAGNOSIS — F1020 Alcohol dependence, uncomplicated: Secondary | ICD-10-CM | POA: Insufficient documentation

## 2013-03-03 HISTORY — DX: Insomnia, unspecified: G47.00

## 2013-03-03 MED ORDER — OMEPRAZOLE 20 MG PO CPDR: 20.0000 mg | DELAYED_RELEASE_CAPSULE | Freq: Every day | ORAL | Status: DC

## 2013-03-03 MED ORDER — POTASSIUM CHLORIDE CRYS ER 20 MEQ PO TBCR: 20.0000 meq | EXTENDED_RELEASE_TABLET | Freq: Two times a day (BID) | ORAL | Status: DC

## 2013-03-03 MED ORDER — SPIRONOLACTONE 100 MG PO TABS: 100.0000 mg | ORAL_TABLET | Freq: Four times a day (QID) | ORAL | Status: DC

## 2013-03-03 MED ORDER — FERROUS SULFATE 325 (65 FE) MG PO TABS: 325.0000 mg | ORAL_TABLET | Freq: Every day | ORAL | Status: DC

## 2013-03-03 MED ORDER — ZOLPIDEM TARTRATE 10 MG PO TABS: 10.0000 mg | ORAL_TABLET | Freq: Every day | ORAL | Status: AC

## 2013-03-03 MED ORDER — FUROSEMIDE 80 MG PO TABS: 80.0000 mg | ORAL_TABLET | Freq: Two times a day (BID) | ORAL | Status: DC

## 2013-03-03 NOTE — ED Notes (Signed)
States is here for med refill. Pt has med bottles with him.

## 2013-03-03 NOTE — ED Provider Notes (Signed)
History  This chart was scribed for Roxy Horseman - PA by Manuela Schwartz, ED scribe. This patient was seen in room TR07C/TR07C and the patient's care was started at 1616.  CSN: [id number] Arrival date & time 03/03/13  1616  None    Chief Complaint  Patient presents with  . Medication Refill   The history is provided by the patient. No language interpreter was used.   HPI Comments: Clayton Watson is a 52 y.o. male who presents to the Emergency Department complaining of out of regular medicines and needs a refill because his primary care physician has not been able to do so. He states his PCP has been forgetting which medicines to refill and is now having associated swelling b/c he cannot take his normal medicines. He brought his medicine bottles with him which are empty and states he just needs a prescription to refill them so he can start looking for a new PCP    Past Medical History  Diagnosis Date  . Cirrhosis of liver   . Ascitic fluid   . Alcoholism   . Anxiety   . History of stomach ulcers   . Insomnia    Past Surgical History  Procedure Laterality Date  . Appendectomy    . Hernia repair      inguinal hernia x 2   Family History  Problem Relation Age of Onset  . Colon cancer Neg Hx   . Liver disease Brother     Hep C-Blood Transfusion    History  Substance Use Topics  . Smoking status: Former Smoker -- 0.00 packs/day    Quit date: 12/26/2011  . Smokeless tobacco: Never Used  . Alcohol Use: No    Review of Systems  Constitutional: Negative for fever and chills.  Respiratory: Negative for shortness of breath.   Gastrointestinal: Negative for nausea and vomiting.  Neurological: Negative for weakness.  All other systems reviewed and are negative.   A complete 10 system review of systems was obtained and all systems are negative except as noted in the HPI and PMH.   Allergies  Nsaids  Home Medications   Current Outpatient Rx  Name  Route  Sig  Dispense  Refill   . furosemide (LASIX) 80 MG tablet   Oral   Take 80 mg by mouth 2 (two) times daily.         . IRON PO   Oral   Take 1 tablet by mouth daily.         . Multiple Vitamin (MULITIVITAMIN WITH MINERALS) TABS   Oral   Take 1 tablet by mouth daily.         Marland Kitchen omeprazole (PRILOSEC) 20 MG capsule   Oral   Take 20 mg by mouth daily.         . potassium chloride SA (K-DUR,KLOR-CON) 20 MEQ tablet   Oral   Take 20 mEq by mouth 2 (two) times daily.         Marland Kitchen spironolactone (ALDACTONE) 100 MG tablet   Oral   Take 100 mg by mouth 4 (four) times daily.         Marland Kitchen zolpidem (AMBIEN) 10 MG tablet   Oral   Take 10 mg by mouth at bedtime.          Triage Vitals: BP 107/73  Pulse 99  Temp(Src) 97.9 F (36.6 C) (Oral)  Resp 18  SpO2 99% Physical Exam  Nursing note and vitals reviewed. Constitutional: He is oriented to  person, place, and time. He appears well-developed and well-nourished.  HENT:  Head: Normocephalic.  Eyes: EOM are normal.  Neck: Normal range of motion.  Pulmonary/Chest: Effort normal.  Abdominal: He exhibits no distension.  Musculoskeletal: Normal range of motion.  Neurological: He is alert and oriented to person, place, and time.  Psychiatric: He has a normal mood and affect.    ED Course  Procedures (including critical care time) DIAGNOSTIC STUDIES: Oxygen Saturation is 99% on room air, normal by my interpretation.    COORDINATION OF CARE: At 445 PM Discussed treatment plan with patient which includes refill of his normal medicines. Patient agrees.   Labs Reviewed - No data to display No results found. 1. Medication refill     MDM  I personally performed the services described in this documentation, which was scribed in my presence. The recorded information has been reviewed and is accurate.    Roxy Horseman, PA-C 03/10/13 1146

## 2013-03-12 NOTE — ED Provider Notes (Signed)
Medical screening examination/treatment/procedure(s) were performed by non-physician practitioner and as supervising physician I was immediately available for consultation/collaboration.  Doug Sou, MD 03/12/13 1454

## 2014-02-25 ENCOUNTER — Emergency Department (HOSPITAL_COMMUNITY)
Admission: EM | Admit: 2014-02-25 | Discharge: 2014-02-25 | Disposition: A | Discharge status: Home / Self Care | Payer: No Typology Code available for payment source | Attending: Emergency Medicine | Admitting: Emergency Medicine

## 2014-02-25 ENCOUNTER — Emergency Department (HOSPITAL_COMMUNITY): Payer: No Typology Code available for payment source

## 2014-02-25 ENCOUNTER — Encounter (HOSPITAL_COMMUNITY): Payer: Self-pay | Admitting: Emergency Medicine

## 2014-02-25 DIAGNOSIS — F1021 Alcohol dependence, in remission: Secondary | ICD-10-CM | POA: Insufficient documentation

## 2014-02-25 DIAGNOSIS — S92151A Displaced avulsion fracture (chip fracture) of right talus, initial encounter for closed fracture: Secondary | ICD-10-CM

## 2014-02-25 DIAGNOSIS — Z87891 Personal history of nicotine dependence: Secondary | ICD-10-CM | POA: Insufficient documentation

## 2014-02-25 DIAGNOSIS — G4700 Insomnia, unspecified: Secondary | ICD-10-CM | POA: Insufficient documentation

## 2014-02-25 DIAGNOSIS — K259 Gastric ulcer, unspecified as acute or chronic, without hemorrhage or perforation: Secondary | ICD-10-CM | POA: Insufficient documentation

## 2014-02-25 DIAGNOSIS — Y9289 Other specified places as the place of occurrence of the external cause: Secondary | ICD-10-CM | POA: Insufficient documentation

## 2014-02-25 DIAGNOSIS — S92109A Unspecified fracture of unspecified talus, initial encounter for closed fracture: Secondary | ICD-10-CM | POA: Insufficient documentation

## 2014-02-25 DIAGNOSIS — Y9389 Activity, other specified: Secondary | ICD-10-CM | POA: Insufficient documentation

## 2014-02-25 DIAGNOSIS — T148XXA Other injury of unspecified body region, initial encounter: Secondary | ICD-10-CM

## 2014-02-25 DIAGNOSIS — W208XXA Other cause of strike by thrown, projected or falling object, initial encounter: Secondary | ICD-10-CM | POA: Insufficient documentation

## 2014-02-25 DIAGNOSIS — S92253A Displaced fracture of navicular [scaphoid] of unspecified foot, initial encounter for closed fracture: Secondary | ICD-10-CM | POA: Insufficient documentation

## 2014-02-25 DIAGNOSIS — S92251A Displaced fracture of navicular [scaphoid] of right foot, initial encounter for closed fracture: Secondary | ICD-10-CM

## 2014-02-25 DIAGNOSIS — Z79899 Other long term (current) drug therapy: Secondary | ICD-10-CM | POA: Insufficient documentation

## 2014-02-25 DIAGNOSIS — Z8659 Personal history of other mental and behavioral disorders: Secondary | ICD-10-CM | POA: Insufficient documentation

## 2014-02-25 MED ORDER — HYDROCODONE-ACETAMINOPHEN 5-325 MG PO TABS
1.0000 | ORAL_TABLET | Freq: Once | ORAL | Status: AC
  Administered 2014-02-25: 1 via ORAL
  Filled 2014-02-25: qty 1

## 2014-02-25 MED ORDER — HYDROCODONE-ACETAMINOPHEN 5-325 MG PO TABS: 1.0000 | ORAL_TABLET | Freq: Four times a day (QID) | ORAL | Status: DC | PRN

## 2014-02-25 NOTE — ED Notes (Signed)
Ortho tech paged  
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
## 2014-02-25 NOTE — Progress Notes (Signed)
Orthopedic Tech Progress Note Patient Details:  Clayton Watson [birth year] [id number]  Ortho Devices Type of Ortho Device: Crutches;CAM walker Ortho Device/Splint Interventions: Application   Cammer, Mickie BailJennifer Carol 02/25/2014, 5:05 PM

## 2014-02-25 NOTE — Discharge Instructions (Signed)
Return here as needed. Follow up with the orthopedist provided.  °
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
## 2014-03-06 NOTE — ED Provider Notes (Signed)
Medical screening examination/treatment/procedure(s) were performed by non-physician practitioner and as supervising physician I was immediately available for consultation/collaboration.   EKG Interpretation None        Rolan BuccoMelanie Kamelia Lampkins, MD 03/06/14 [phone]

## 2014-08-02 ENCOUNTER — Inpatient Hospital Stay (HOSPITAL_COMMUNITY)
Admission: EM | Admit: 2014-08-02 | Discharge: 2014-08-04 | DRG: 871 | Discharge status: Against Medical Advice | Payer: Medicare Other | Attending: Internal Medicine | Admitting: Internal Medicine

## 2014-08-02 ENCOUNTER — Encounter (HOSPITAL_COMMUNITY): Payer: Self-pay | Admitting: *Deleted

## 2014-08-02 DIAGNOSIS — J90 Pleural effusion, not elsewhere classified: Secondary | ICD-10-CM | POA: Diagnosis present

## 2014-08-02 DIAGNOSIS — K650 Generalized (acute) peritonitis: Secondary | ICD-10-CM | POA: Diagnosis present

## 2014-08-02 DIAGNOSIS — K7460 Unspecified cirrhosis of liver: Secondary | ICD-10-CM | POA: Diagnosis present

## 2014-08-02 DIAGNOSIS — R188 Other ascites: Secondary | ICD-10-CM | POA: Diagnosis present

## 2014-08-02 DIAGNOSIS — F1010 Alcohol abuse, uncomplicated: Secondary | ICD-10-CM | POA: Insufficient documentation

## 2014-08-02 DIAGNOSIS — K7031 Alcoholic cirrhosis of liver with ascites: Secondary | ICD-10-CM | POA: Insufficient documentation

## 2014-08-02 DIAGNOSIS — R17 Unspecified jaundice: Secondary | ICD-10-CM | POA: Diagnosis present

## 2014-08-02 DIAGNOSIS — A419 Sepsis, unspecified organism: Secondary | ICD-10-CM | POA: Diagnosis present

## 2014-08-02 DIAGNOSIS — J189 Pneumonia, unspecified organism: Secondary | ICD-10-CM | POA: Diagnosis present

## 2014-08-02 DIAGNOSIS — R4182 Altered mental status, unspecified: Secondary | ICD-10-CM | POA: Insufficient documentation

## 2014-08-02 DIAGNOSIS — F1020 Alcohol dependence, uncomplicated: Secondary | ICD-10-CM | POA: Diagnosis present

## 2014-08-02 DIAGNOSIS — D689 Coagulation defect, unspecified: Secondary | ICD-10-CM | POA: Diagnosis present

## 2014-08-02 DIAGNOSIS — I81 Portal vein thrombosis: Secondary | ICD-10-CM | POA: Insufficient documentation

## 2014-08-02 DIAGNOSIS — K7290 Hepatic failure, unspecified without coma: Secondary | ICD-10-CM | POA: Diagnosis present

## 2014-08-02 DIAGNOSIS — E872 Acidosis, unspecified: Secondary | ICD-10-CM

## 2014-08-02 DIAGNOSIS — R6520 Severe sepsis without septic shock: Secondary | ICD-10-CM | POA: Diagnosis present

## 2014-08-02 DIAGNOSIS — D72829 Elevated white blood cell count, unspecified: Secondary | ICD-10-CM | POA: Diagnosis present

## 2014-08-02 DIAGNOSIS — F419 Anxiety disorder, unspecified: Secondary | ICD-10-CM | POA: Diagnosis present

## 2014-08-02 DIAGNOSIS — I429 Cardiomyopathy, unspecified: Secondary | ICD-10-CM | POA: Diagnosis present

## 2014-08-02 DIAGNOSIS — G92 Toxic encephalopathy: Secondary | ICD-10-CM | POA: Diagnosis present

## 2014-08-02 DIAGNOSIS — Z87891 Personal history of nicotine dependence: Secondary | ICD-10-CM

## 2014-08-02 DIAGNOSIS — D649 Anemia, unspecified: Secondary | ICD-10-CM | POA: Diagnosis present

## 2014-08-02 LAB — URINALYSIS, ROUTINE W REFLEX MICROSCOPIC
BILIRUBIN URINE: NEGATIVE
Glucose, UA: NEGATIVE mg/dL
HGB URINE DIPSTICK: NEGATIVE
Ketones, ur: NEGATIVE mg/dL
Leukocytes, UA: NEGATIVE
Nitrite: NEGATIVE
Protein, ur: NEGATIVE mg/dL
SPECIFIC GRAVITY, URINE: 1.013 (ref 1.005–1.030)
UROBILINOGEN UA: 0.2 mg/dL (ref 0.0–1.0)
pH: 5 (ref 5.0–8.0)

## 2014-08-02 LAB — PROTIME-INR
INR: 2.12 — ABNORMAL HIGH (ref 0.00–1.49)
PROTHROMBIN TIME: 23.9 s — AB (ref 11.6–15.2)

## 2014-08-02 LAB — COMPREHENSIVE METABOLIC PANEL
ALBUMIN: 2.3 g/dL — AB (ref 3.5–5.2)
ALK PHOS: 120 U/L — AB (ref 39–117)
ALT: 155 U/L — AB (ref 0–53)
AST: 102 U/L — AB (ref 0–37)
Anion gap: 23 — ABNORMAL HIGH (ref 5–15)
BILIRUBIN TOTAL: 2.7 mg/dL — AB (ref 0.3–1.2)
BUN: 27 mg/dL — ABNORMAL HIGH (ref 6–23)
CHLORIDE: 85 meq/L — AB (ref 96–112)
CO2: 15 mEq/L — ABNORMAL LOW (ref 19–32)
CREATININE: 1.25 mg/dL (ref 0.50–1.35)
Calcium: 7.5 mg/dL — ABNORMAL LOW (ref 8.4–10.5)
GFR calc Af Amer: 74 mL/min — ABNORMAL LOW (ref >90)
GFR calc non Af Amer: 64 mL/min — ABNORMAL LOW (ref >90)
Glucose, Bld: 144 mg/dL — ABNORMAL HIGH (ref 70–99)
POTASSIUM: 4 meq/L (ref 3.7–5.3)
SODIUM: 123 meq/L — AB (ref 137–147)
Total Protein: 5 g/dL — ABNORMAL LOW (ref 6.0–8.3)

## 2014-08-02 LAB — CBC
HEMATOCRIT: 9.9 % — AB (ref 39.0–52.0)
Hemoglobin: 2.8 g/dL — CL (ref 13.0–17.0)
MCH: 21.4 pg — ABNORMAL LOW (ref 26.0–34.0)
MCHC: 28.3 g/dL — ABNORMAL LOW (ref 30.0–36.0)
MCV: 75.6 fL — AB (ref 78.0–100.0)
Platelets: 207 10*3/uL (ref 150–400)
RBC: 1.31 MIL/uL — ABNORMAL LOW (ref 4.22–5.81)
RDW: 20.7 % — AB (ref 11.5–15.5)
WBC: 22.4 10*3/uL — AB (ref 4.0–10.5)

## 2014-08-02 LAB — AMMONIA: Ammonia: 16 umol/L (ref 11–60)

## 2014-08-02 LAB — I-STAT CG4 LACTIC ACID, ED: LACTIC ACID, VENOUS: 8.9 mmol/L — AB (ref 0.5–2.2)

## 2014-08-02 MED ORDER — DEXTROSE 5 % IV SOLN: 2.0000 g | INTRAVENOUS | Status: DC

## 2014-08-02 MED ORDER — PIPERACILLIN-TAZOBACTAM 3.375 G IVPB 30 MIN
3.3750 g | Freq: Once | INTRAVENOUS | Status: AC
  Administered 2014-08-03: 3.375 g via INTRAVENOUS
  Filled 2014-08-02: qty 50

## 2014-08-02 MED ORDER — VANCOMYCIN HCL IN DEXTROSE 1-5 GM/200ML-% IV SOLN
1000.0000 mg | Freq: Once | INTRAVENOUS | Status: DC
  Filled 2014-08-02: qty 200

## 2014-08-02 MED ORDER — LIDOCAINE HCL (PF) 1 % IJ SOLN
INTRAMUSCULAR | Status: AC
  Filled 2014-08-02: qty 5

## 2014-08-02 MED ORDER — SODIUM CHLORIDE 0.9 % IV BOLUS (SEPSIS): 1000.0000 mL | Freq: Once | INTRAVENOUS | Status: DC

## 2014-08-02 NOTE — ED Notes (Signed)
PT monitored by pulse ox, bp cuff, and 12-lead. 

## 2014-08-02 NOTE — ED Notes (Signed)
Pt in with brother who reports a severe decline in patients health in the last three weeks, states patient has a history of alcohol induced cirrhosis and liver failure, this had improved after being sober for three years, brother visited patient three weeks ago and found him to be incoherent, pt had been drinking again and taking his medication improperly, pt last had ETOH a week ago, pt abdomen more distended than normal, pt pale and yellow, alert and oriented to person and place, confused on recent events, c/o back pain
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
## 2014-08-02 NOTE — ED Notes (Signed)
I stat lactic acid results given to Dr. Walden by B. Shenna Brissette, EMT 

## 2014-08-03 ENCOUNTER — Inpatient Hospital Stay (HOSPITAL_COMMUNITY): Payer: Medicare Other

## 2014-08-03 ENCOUNTER — Encounter (HOSPITAL_COMMUNITY): Payer: Self-pay | Admitting: Certified Registered Nurse Anesthetist

## 2014-08-03 DIAGNOSIS — F419 Anxiety disorder, unspecified: Secondary | ICD-10-CM | POA: Diagnosis present

## 2014-08-03 DIAGNOSIS — R4182 Altered mental status, unspecified: Secondary | ICD-10-CM | POA: Insufficient documentation

## 2014-08-03 DIAGNOSIS — D689 Coagulation defect, unspecified: Secondary | ICD-10-CM | POA: Diagnosis present

## 2014-08-03 DIAGNOSIS — Z87891 Personal history of nicotine dependence: Secondary | ICD-10-CM | POA: Diagnosis not present

## 2014-08-03 DIAGNOSIS — G92 Toxic encephalopathy: Secondary | ICD-10-CM | POA: Diagnosis present

## 2014-08-03 DIAGNOSIS — I81 Portal vein thrombosis: Secondary | ICD-10-CM | POA: Insufficient documentation

## 2014-08-03 DIAGNOSIS — J90 Pleural effusion, not elsewhere classified: Secondary | ICD-10-CM | POA: Diagnosis present

## 2014-08-03 DIAGNOSIS — R188 Other ascites: Secondary | ICD-10-CM | POA: Diagnosis present

## 2014-08-03 DIAGNOSIS — E872 Acidosis, unspecified: Secondary | ICD-10-CM

## 2014-08-03 DIAGNOSIS — I429 Cardiomyopathy, unspecified: Secondary | ICD-10-CM | POA: Diagnosis present

## 2014-08-03 DIAGNOSIS — K7031 Alcoholic cirrhosis of liver with ascites: Secondary | ICD-10-CM | POA: Insufficient documentation

## 2014-08-03 DIAGNOSIS — R17 Unspecified jaundice: Secondary | ICD-10-CM | POA: Diagnosis present

## 2014-08-03 DIAGNOSIS — D649 Anemia, unspecified: Secondary | ICD-10-CM | POA: Diagnosis present

## 2014-08-03 DIAGNOSIS — D72829 Elevated white blood cell count, unspecified: Secondary | ICD-10-CM | POA: Diagnosis present

## 2014-08-03 DIAGNOSIS — F1010 Alcohol abuse, uncomplicated: Secondary | ICD-10-CM

## 2014-08-03 DIAGNOSIS — R400 Somnolence: Secondary | ICD-10-CM

## 2014-08-03 DIAGNOSIS — K650 Generalized (acute) peritonitis: Secondary | ICD-10-CM | POA: Diagnosis present

## 2014-08-03 DIAGNOSIS — K7290 Hepatic failure, unspecified without coma: Secondary | ICD-10-CM | POA: Diagnosis present

## 2014-08-03 DIAGNOSIS — F1020 Alcohol dependence, uncomplicated: Secondary | ICD-10-CM | POA: Diagnosis present

## 2014-08-03 DIAGNOSIS — R6520 Severe sepsis without septic shock: Secondary | ICD-10-CM

## 2014-08-03 DIAGNOSIS — K7460 Unspecified cirrhosis of liver: Secondary | ICD-10-CM | POA: Diagnosis present

## 2014-08-03 DIAGNOSIS — A419 Sepsis, unspecified organism: Secondary | ICD-10-CM | POA: Diagnosis present

## 2014-08-03 DIAGNOSIS — I059 Rheumatic mitral valve disease, unspecified: Secondary | ICD-10-CM

## 2014-08-03 DIAGNOSIS — J189 Pneumonia, unspecified organism: Secondary | ICD-10-CM | POA: Diagnosis present

## 2014-08-03 LAB — CBC WITH DIFFERENTIAL/PLATELET
BASOS PCT: 0 % (ref 0–1)
Basophils Absolute: 0 10*3/uL (ref 0.0–0.1)
Eosinophils Absolute: 0 10*3/uL (ref 0.0–0.7)
Eosinophils Relative: 0 % (ref 0–5)
HCT: 8.8 % — ABNORMAL LOW (ref 39.0–52.0)
HEMOGLOBIN: 2.5 g/dL — AB (ref 13.0–17.0)
LYMPHS PCT: 4 % — AB (ref 12–46)
Lymphs Abs: 0.8 10*3/uL (ref 0.7–4.0)
MCH: 21.2 pg — AB (ref 26.0–34.0)
MCHC: 28.4 g/dL — AB (ref 30.0–36.0)
MCV: 74.6 fL — ABNORMAL LOW (ref 78.0–100.0)
MONO ABS: 3.4 10*3/uL — AB (ref 0.1–1.0)
Monocytes Relative: 18 % — ABNORMAL HIGH (ref 3–12)
NEUTROS ABS: 14.8 10*3/uL — AB (ref 1.7–7.7)
Neutrophils Relative %: 78 % — ABNORMAL HIGH (ref 43–77)
Platelets: 132 10*3/uL — ABNORMAL LOW (ref 150–400)
RBC: 1.18 MIL/uL — ABNORMAL LOW (ref 4.22–5.81)
RDW: 20.6 % — ABNORMAL HIGH (ref 11.5–15.5)
WBC: 19 10*3/uL — ABNORMAL HIGH (ref 4.0–10.5)

## 2014-08-03 LAB — GRAM STAIN

## 2014-08-03 LAB — PREPARE RBC (CROSSMATCH)

## 2014-08-03 LAB — CBC
HCT: 15.7 % — ABNORMAL LOW (ref 39.0–52.0)
Hemoglobin: 5.1 g/dL — CL (ref 13.0–17.0)
MCH: 25.1 pg — ABNORMAL LOW (ref 26.0–34.0)
MCHC: 32.5 g/dL (ref 30.0–36.0)
MCV: 77.3 fL — ABNORMAL LOW (ref 78.0–100.0)
PLATELETS: 114 10*3/uL — AB (ref 150–400)
RBC: 2.03 MIL/uL — ABNORMAL LOW (ref 4.22–5.81)
RDW: 18 % — AB (ref 11.5–15.5)
WBC: 14.8 10*3/uL — AB (ref 4.0–10.5)

## 2014-08-03 LAB — BODY FLUID CELL COUNT WITH DIFFERENTIAL
EOS FL: 0 %
Lymphs, Fluid: 0 %
Monocyte-Macrophage-Serous Fluid: 64 % (ref 50–90)
Neutrophil Count, Fluid: 35 % — ABNORMAL HIGH (ref 0–25)
Total Nucleated Cell Count, Fluid: 175 cu mm (ref 0–1000)

## 2014-08-03 LAB — GLUCOSE, SEROUS FLUID: GLUCOSE FL: 151 mg/dL

## 2014-08-03 LAB — LACTATE DEHYDROGENASE, PLEURAL OR PERITONEAL FLUID: LD, Fluid: 28 U/L — ABNORMAL HIGH (ref 3–23)

## 2014-08-03 LAB — PROTEIN, BODY FLUID: TOTAL PROTEIN, FLUID: 0.2 g/dL

## 2014-08-03 LAB — OCCULT BLOOD, POC DEVICE: FECAL OCCULT BLD: POSITIVE — AB

## 2014-08-03 LAB — PATHOLOGIST SMEAR REVIEW

## 2014-08-03 LAB — AMYLASE, PERITONEAL FLUID: Amylase, peritoneal fluid: 6 U/L

## 2014-08-03 LAB — MRSA PCR SCREENING: MRSA BY PCR: NEGATIVE

## 2014-08-03 MED ORDER — PANTOPRAZOLE SODIUM 40 MG IV SOLR
40.0000 mg | Freq: Two times a day (BID) | INTRAVENOUS | Status: DC
  Administered 2014-08-03 (×3): 40 mg via INTRAVENOUS
  Filled 2014-08-03 (×5): qty 40

## 2014-08-03 MED ORDER — VANCOMYCIN HCL IN DEXTROSE 1-5 GM/200ML-% IV SOLN
1000.0000 mg | Freq: Two times a day (BID) | INTRAVENOUS | Status: DC
  Administered 2014-08-03 – 2014-08-04 (×2): 1000 mg via INTRAVENOUS
  Filled 2014-08-03 (×3): qty 200

## 2014-08-03 MED ORDER — LORAZEPAM 1 MG PO TABS: 1.0000 mg | ORAL_TABLET | Freq: Four times a day (QID) | ORAL | Status: DC | PRN

## 2014-08-03 MED ORDER — SODIUM CHLORIDE 0.9 % IV SOLN
INTRAVENOUS | Status: DC
  Administered 2014-08-03: 02:00:00 via INTRAVENOUS

## 2014-08-03 MED ORDER — DIPHENHYDRAMINE HCL 50 MG/ML IJ SOLN
25.0000 mg | Freq: Once | INTRAMUSCULAR | Status: AC
  Administered 2014-08-03: 25 mg via INTRAVENOUS
  Filled 2014-08-03: qty 1

## 2014-08-03 MED ORDER — SODIUM CHLORIDE 0.9 % IV SOLN: INTRAVENOUS | Status: DC

## 2014-08-03 MED ORDER — FUROSEMIDE 10 MG/ML IJ SOLN
20.0000 mg | Freq: Once | INTRAMUSCULAR | Status: AC
  Administered 2014-08-03: 20 mg via INTRAVENOUS
  Filled 2014-08-03: qty 2

## 2014-08-03 MED ORDER — SODIUM CHLORIDE 0.9 % IV SOLN
Freq: Once | INTRAVENOUS | Status: AC
  Administered 2014-08-03: 17:00:00 via INTRAVENOUS

## 2014-08-03 MED ORDER — ENSURE COMPLETE PO LIQD
237.0000 mL | Freq: Two times a day (BID) | ORAL | Status: DC
  Administered 2014-08-03: 237 mL via ORAL

## 2014-08-03 MED ORDER — THIAMINE HCL 100 MG/ML IJ SOLN
100.0000 mg | Freq: Every day | INTRAMUSCULAR | Status: DC
  Filled 2014-08-03 (×2): qty 1

## 2014-08-03 MED ORDER — LORAZEPAM 2 MG/ML IJ SOLN: 0.0000 mg | Freq: Two times a day (BID) | INTRAMUSCULAR | Status: DC

## 2014-08-03 MED ORDER — ADULT MULTIVITAMIN W/MINERALS CH
1.0000 | ORAL_TABLET | Freq: Every day | ORAL | Status: DC
  Administered 2014-08-03: 1 via ORAL
  Filled 2014-08-03 (×2): qty 1

## 2014-08-03 MED ORDER — SODIUM CHLORIDE 0.9 % IJ SOLN
3.0000 mL | Freq: Two times a day (BID) | INTRAMUSCULAR | Status: DC
  Administered 2014-08-03: 3 mL via INTRAVENOUS

## 2014-08-03 MED ORDER — FOLIC ACID 1 MG PO TABS
1.0000 mg | ORAL_TABLET | Freq: Every day | ORAL | Status: DC
  Administered 2014-08-03: 1 mg via ORAL
  Filled 2014-08-03 (×2): qty 1

## 2014-08-03 MED ORDER — VITAMIN B-1 100 MG PO TABS
100.0000 mg | ORAL_TABLET | Freq: Every day | ORAL | Status: DC
  Administered 2014-08-03: 100 mg via ORAL
  Filled 2014-08-03 (×2): qty 1

## 2014-08-03 MED ORDER — SODIUM CHLORIDE 0.9 % IV BOLUS (SEPSIS)
250.0000 mL | Freq: Once | INTRAVENOUS | Status: AC
  Administered 2014-08-03: 250 mL via INTRAVENOUS

## 2014-08-03 MED ORDER — LORAZEPAM 2 MG/ML IJ SOLN: 1.0000 mg | Freq: Four times a day (QID) | INTRAMUSCULAR | Status: DC | PRN

## 2014-08-03 MED ORDER — SODIUM CHLORIDE 0.9 % IJ SOLN
3.0000 mL | Freq: Two times a day (BID) | INTRAMUSCULAR | Status: DC
  Administered 2014-08-03 (×3): 3 mL via INTRAVENOUS

## 2014-08-03 MED ORDER — LORAZEPAM 2 MG/ML IJ SOLN: 0.0000 mg | Freq: Four times a day (QID) | INTRAMUSCULAR | Status: DC

## 2014-08-03 MED ORDER — PIPERACILLIN-TAZOBACTAM 3.375 G IVPB
3.3750 g | Freq: Three times a day (TID) | INTRAVENOUS | Status: DC
  Administered 2014-08-03 – 2014-08-04 (×4): 3.375 g via INTRAVENOUS
  Filled 2014-08-03 (×6): qty 50

## 2014-08-03 MED ORDER — METHYLPREDNISOLONE SODIUM SUCC 125 MG IJ SOLR
125.0000 mg | Freq: Once | INTRAMUSCULAR | Status: AC
  Administered 2014-08-03: 125 mg via INTRAVENOUS
  Filled 2014-08-03: qty 2

## 2014-08-03 NOTE — Progress Notes (Signed)
INITIAL NUTRITION ASSESSMENT  DOCUMENTATION CODES Per approved criteria  -Not Applicable   INTERVENTION: Ensure Complete po BID, each supplement provides 350 kcal and 13 grams of protein  NUTRITION DIAGNOSIS: Inadequate oral intake related to cirrhosis as evidenced by reported intake less than estimated needs and wt loss  Goal: Pt to meet >/= 90% of their estimated nutrition needs   Monitor:  Weight trend, po intake, acceptance of supplements, labs  Reason for Assessment: MST  53 y.o. male  Admitting Dx: <principal problem not specified>  ASSESSMENT: 53 y.o. year old male with significant past medical history of liver cirrhosis w/ hx/o being on [facility] transplant list, ETOH abuse, coagulopathy presenting with encephalopaty, anemia, lactic acidosis, coagulopathy. Primary history from patient's brother. Per report, patient has had a history of end-stage liver disease followed by [facility] transplant service. Was ruled out the transplant list approximately 1-1/2 years ago secondary to improving liver function. Per brother, patient was alcohol free for approximately 3-1/2 years. However, patient as well as brothers reports patient with a 2 week drinking binge 3-4 weeks ago.   - Pt reports poor po for last several days. He has experienced a 4 lb wt loss. He says that he feels hungry today. Agreed to try nutritional supplements. Pt with mild fat and muscle wasting at the temples.   Height: Ht Readings from Last 1 Encounters:  08/03/14 5\' 9"  (1.753 m)    Weight: Wt Readings from Last 1 Encounters:  08/03/14 186 lb 4.6 oz (84.5 kg)    Ideal Body Weight: 70.7 kg  % Ideal Body Weight: 120%  Wt Readings from Last 10 Encounters:  08/03/14 186 lb 4.6 oz (84.5 kg)  12/25/11 182 lb (82.555 kg)  11/26/11 160 lb (72.576 kg)  09/25/11 185 lb (83.915 kg)    Usual Body Weight: 190 lbs  % Usual Body Weight: 98%  BMI:  Body mass index is 27.5 kg/(m^2).  Estimated Nutritional Needs: Kcal:  2100-2300 Protein: 105-120 g Fluid: 2.1-2.3 L/day  Skin: Intact  Diet Order: Diet heart healthy/carb modified  EDUCATION NEEDS: -Education needs addressed   Intake/Output Summary (Last 24 hours) at 08/03/14 1035 Last data filed at 08/03/14 0820  Gross per 24 hour  Intake    915 ml  Output    400 ml  Net    515 ml    Last BM: prior to admission   Labs:   Recent Labs Lab 08/02/14 2224  NA 123*  K 4.0  CL 85*  CO2 15*  BUN 27*  CREATININE 1.25  CALCIUM 7.5*  GLUCOSE 144*    CBG (last 3)  No results for input(s): GLUCAP in the last 72 hours.  Scheduled Meds: . lidocaine (PF)      . pantoprazole (PROTONIX) IV  40 mg Intravenous Q12H  . piperacillin-tazobactam (ZOSYN)  IV  3.375 g Intravenous 3 times per day  . sodium chloride  1,000 mL Intravenous Once  . sodium chloride  3 mL Intravenous Q12H  . sodium chloride  3 mL Intravenous Q12H  . vancomycin  1,000 mg Intravenous Once  . vancomycin  1,000 mg Intravenous Q12H    Continuous Infusions: . sodium chloride    . sodium chloride 100 mL/hr at 08/03/14 0209    Past Medical History  Diagnosis Date  . Cirrhosis of liver   . Ascitic fluid   . Alcoholism   . Anxiety   . History of stomach ulcers   . Insomnia     Past Surgical History  Procedure Laterality Date  . Appendectomy    . Hernia repair      inguinal hernia x 2    Emmaline KluverHaley Sequoyah Counterman MS, RD, LDN

## 2014-08-03 NOTE — Progress Notes (Signed)
MD aware about the repeat hgb of 5.1 .awaiting order.

## 2014-08-03 NOTE — ED Provider Notes (Signed)
PARACENTESIS Date/Time: 08/03/2014 12:09 AM Performed by: Elwin MochaWALDEN, Sherrye Puga Authorized by: Elwin MochaWALDEN, Andrina Locken Consent: Verbal consent obtained. Written consent obtained. Initial or subsequent exam: initial Procedure purpose: diagnostic Indications: new onset ascites and secondary bacterial peritonitis Anesthesia: local infiltration Local anesthetic: lidocaine 1% without epinephrine Anesthetic total: 3 ml Patient sedated: no Preparation: Patient was prepped and draped in the usual sterile fashion. Needle gauge: 18 Ultrasound guidance: yes Puncture site: left lower quadrant Fluid removed: 60(ml) Fluid appearance: clear and serosanguinous (straw-colored) Dressing: 4x4 sterile gauze   Medical screening examination/treatment/procedure(s) were conducted as a shared visit with non-physician practitioner(s) and myself.  I personally evaluated the patient during the encounter.   EKG Interpretation None      Patient here with altered mental status, confusion. Hx of cirrhosis, was previously on liver transplant list was didn't end up getting on. Has been drinking recently, last drink over 1 week ago. Worsening ascites of late. Hx of paracenteses, however hasn't had bad ascites recently. Here alert, oriented to place. Large volume ascites present, paracentesis performed as above for elevated white count, elevated lactate. CMP shows hyponatremia, hypochloremia. Broad spectrum antibiotics given. Severely anemic, 2 units initiated for transfusion. Admitted.  CRITICAL CARE Performed by: Dagmar HaitWALDEN, WILLIAM Dorene Bruni   Total critical care time: 30 minutes  Critical care time was exclusive of separately billable procedures and treating other patients.  Critical care was necessary to treat or prevent imminent or life-threatening deterioration.  Critical care was time spent personally by me on the following activities: development of treatment plan with patient and/or surrogate as well as nursing, discussions  with consultants, evaluation of patient's response to treatment, examination of patient, obtaining history from patient or surrogate, ordering and performing treatments and interventions, ordering and review of laboratory studies, ordering and review of radiographic studies, pulse oximetry and re-evaluation of patient's condition.   Elwin MochaBlair Elizah Mierzwa, MD 08/03/14 [phone]

## 2014-08-03 NOTE — Progress Notes (Signed)
Blood transfusion in progress, endorsed.

## 2014-08-03 NOTE — Progress Notes (Signed)
Spoke with blood bank in regards to when to expect blood to be ready. They have made me aware that they are type and crossing two units now and should be ready within the next half hour. Will continue to monitor pt closely.

## 2014-08-03 NOTE — H&P (Addendum)
Hospitalist Admission History and Physical  Patient name: Clayton Watson Medical record number: [id number] Date of birth: May 08, 1961 Age: 53 y.o. Gender: male  Primary Care Provider: Dorrene German, MD  Chief Complaint: encephalopathy, anemia, lactic acidosis, coagulopathy, sepsis    History of Present Illness:This is a 53 y.o. year old male with significant past medical history of liver cirrhosis w/ hx/o being on [facility] transplant list, ETOH abuse, coagulopathy presenting with encephalopaty, anemia, lactic acidosis, coagulopathy. Primary history from patient's brother. Per report, patient has had a history of end-stage liver disease followed by [facility] transplant service. Was ruled out the transplant list approximately 1-1/2 years ago secondary to improving liver function. Per brother, patient was alcohol free for approximately 3-1/2 years. However, patient as well as brothers reports patient with a 2 week drinking binge 3-4 weeks ago. Patient has been alcohol free for at least last 8 days. No fevers or chills. Has been compliant with all medicines. Presented to the ER Tmax 97.9, heart rate in the 90s to 100s, respirations in the tens to 20s, blood pressure in the 100s to 110s, satting greater than 97% on room air. White blood cell count 22.4, hemoglobin 2.8, platelet count 207, creatinine 1.25, bicarbonate 15, INR 2.12. AST 102, ALC 155, alkaline phosphatase 120. Lactate 8.9. Urinalysis negative for infection. Hemoccult positive per ER physician. Paracentesis done by ER physician. Started on Vanc,  Zosyn and ceftriaxone empirically. Head CT WNL. PCM curbsided. Would like pt to be tranfused and then lactate reassessed.     Assessment and Plan: Clayton Watson is a 53 y.o. year old male presenting with Encephalopathy, anemia, sepsis, lactic acidosis, coagulopathy  Active Problems:   Anemia   Leukocytosis   Sepsis   1- Encephalopathy -toxic-metabolic  -mentation fairly clear on exam  -Ammonia WNL   -head CT WNL  -treat active issues as discussed below  2- Anemia -markedly low hgb  -likely multifactorial  -hemoccult positive -pRBC transfusion  -GI consult in am  -hold BB given BP  -PPI -no active hematemesis -defer octreotide  -reassess in am   3- sepsis  -meets criteria on HR and WBC  -concern for GI etiology -cont vanc and zosyn  -blood and peritoneal cultures/studies obtained -trend WBC  -step down bed   4- lactic acidosis  -likely multifactorial w/ contributions of hepatic, infectious, hypoperfusive etiologies -pending pRBC transfusion -gentle hydration -trend   5- Cirrhosis  -Child-Pugh score C  -prior hx/o end stage disease  -abd u/s to assess degree of ascites  -s/p paracentesis at bedside  -hold lasix and aldactone given LLN BP and volume status overnight  -reassess in am  -GI consult  -likely not candidate for transplant list again given recent ETOH abuse    6- Coagulopathy -hepatic in etiology  -HOLD anticoagulation -Plts WNL  -follow     FEN/GI: NPO for now  Prophylaxis: SCDs Disposition: pending further evaluation  Code Status:Full Code    Patient Active Problem List   Diagnosis Date Noted  . Anemia 08/03/2014  . Leukocytosis 08/03/2014  . Shock 11/26/2011  . Liver failure 11/26/2011  . Pulmonary edema 11/26/2011  . Hypoxemia 11/26/2011  . Coagulopathy 11/26/2011   Past Medical History: Past Medical History  Diagnosis Date  . Cirrhosis of liver   . Ascitic fluid   . Alcoholism   . Anxiety   . History of stomach ulcers   . Insomnia     Past Surgical History: Past Surgical History  Procedure Laterality Date  . Appendectomy    .  Hernia repair      inguinal hernia x 2    Social History: History   Social History  . Marital Status: Single    Spouse Name: N/A    Number of Children: 1  . Years of Education: N/A   Occupational History  . Unemployed     Social History Main Topics  . Smoking status: Former Smoker  -- 0.00 packs/day    Quit date: 12/26/2011  . Smokeless tobacco: Never Used  . Alcohol Use: No  . Drug Use: No  . Sexual Activity: None   Other Topics Concern  . None   Social History Narrative   Daily caffeine     Family History: Family History  Problem Relation Age of Onset  . Colon cancer Neg Hx   . Liver disease Brother     Hep C-Blood Transfusion     Allergies: Allergies  Allergen Reactions  . Nsaids     Bleeding due to ulcer     Current Facility-Administered Medications  Medication Dose Route Frequency Provider Last Rate Last Dose  . 0.9 %  sodium chloride infusion   Intravenous Continuous Doree AlbeeSteven Mohannad Olivero, MD      . lidocaine (PF) (XYLOCAINE) 1 % injection           . sodium chloride 0.9 % bolus 1,000 mL  1,000 mL Intravenous Once Elwin MochaBlair Walden, MD   1,000 mL at 08/02/14 2326  . sodium chloride 0.9 % injection 3 mL  3 mL Intravenous Q12H Doree AlbeeSteven Cartha Rotert, MD      . vancomycin (VANCOCIN) IVPB 1000 mg/200 mL premix  1,000 mg Intravenous Once Elwin MochaBlair Walden, MD   1,000 mg at 08/03/14 0054   Current Outpatient Prescriptions  Medication Sig Dispense Refill  . furosemide (LASIX) 80 MG tablet Take 1 tablet (80 mg total) by mouth 2 (two) times daily. 60 tablet 0  . spironolactone (ALDACTONE) 100 MG tablet Take 1 tablet (100 mg total) by mouth 4 (four) times daily. 120 tablet 0  . zolpidem (AMBIEN) 10 MG tablet Take 1 tablet (10 mg total) by mouth at bedtime. 15 tablet 1  . ferrous sulfate 325 (65 FE) MG tablet Take 325 mg by mouth daily as needed (for anemia).    Marland Kitchen. HYDROcodone-acetaminophen (NORCO/VICODIN) 5-325 MG per tablet Take 1 tablet by mouth every 6 (six) hours as needed for moderate pain. (Patient not taking: Reported on 08/02/2014) 15 tablet 0  . Multiple Vitamin (MULITIVITAMIN WITH MINERALS) TABS Take 1 tablet by mouth daily.    Marland Kitchen. omeprazole (PRILOSEC) 20 MG capsule Take 1 capsule (20 mg total) by mouth daily. (Patient not taking: Reported on 08/02/2014) 30 capsule 0    Review Of Systems: 12 point ROS negative except as noted above in HPI.  Physical Exam: Filed Vitals:   08/03/14 0100  BP: 104/46  Pulse: 101  Temp:   Resp: 20    General: cooperative, pale and ill appearing, alert  HEENT: PERRLA and minimal-mild scleral icterus  Heart: S1, S2 normal, no murmur, rub or gallop, regular rate and rhythm Lungs: clear to auscultation, no wheezes or rales and unlabored breathing Abdomen: + abd distension, mild abd TTP  Extremities: extremities normal, atraumatic, no cyanosis or edema Skin:+ mild jaundice  Neurology: grossly normal, no asterixis   Labs and Imaging: Lab Results  Component Value Date/Time   NA 123* 08/02/2014 10:24 PM   K 4.0 08/02/2014 10:24 PM   CL 85* 08/02/2014 10:24 PM   CO2 15* 08/02/2014 10:24 PM  BUN 27* 08/02/2014 10:24 PM   CREATININE 1.25 08/02/2014 10:24 PM   GLUCOSE 144* 08/02/2014 10:24 PM   Lab Results  Component Value Date   WBC 22.4* 08/02/2014   HGB 2.8* 08/02/2014   HCT 9.9* 08/02/2014   MCV 75.6* 08/02/2014   PLT 207 08/02/2014   Urinalysis    Component Value Date/Time   COLORURINE YELLOW 08/02/2014 2344   APPEARANCEUR CLEAR 08/02/2014 2344   LABSPEC 1.013 08/02/2014 2344   PHURINE 5.0 08/02/2014 2344   GLUCOSEU NEGATIVE 08/02/2014 2344   HGBUR NEGATIVE 08/02/2014 2344   BILIRUBINUR NEGATIVE 08/02/2014 2344   KETONESUR NEGATIVE 08/02/2014 2344   PROTEINUR NEGATIVE 08/02/2014 2344   UROBILINOGEN 0.2 08/02/2014 2344   NITRITE NEGATIVE 08/02/2014 2344   LEUKOCYTESUR NEGATIVE 08/02/2014 2344       Ct Head Wo Contrast  08/03/2014   CLINICAL DATA:  Altered mental status. Severe decline in health over the last 3 weeks. History of alcohol cirrhosis and liver failure. Abdominal distention. Confusion.  EXAM: CT HEAD WITHOUT CONTRAST  TECHNIQUE: Contiguous axial images were obtained from the base of the skull through the vertex without intravenous contrast.  COMPARISON:  None.  FINDINGS: Diffuse cerebral  atrophy. Mild low-attenuation changes in the deep white matter consistent with small vessel ischemia. No mass effect or midline shift. No abnormal extra-axial fluid collections. Gray-white matter junctions are distinct. Basal cisterns are not effaced. No evidence of acute intracranial hemorrhage. No depressed skull fractures. Visualized paranasal sinuses and mastoid air cells are not opacified.  IMPRESSION: No acute intracranial abnormalities. Mild chronic atrophy and small vessel ischemic changes.   Electronically Signed   By: Burman NievesWilliam  Stevens M.D.   On: 08/03/2014 00:53           Doree AlbeeSteven Wilberto Console MD  Pager: [phone]

## 2014-08-03 NOTE — Progress Notes (Signed)
ER PA calling for advice   Cirrhotic PA says maintaining vitals and looking ok S/p paracentesis No obvious bleed They have called [facility] for SDU admission   PULMONARY No results for input(s): PHART, PCO2ART, PO2ART, HCO3, TCO2, O2SAT in the last 168 hours.  Invalid input(s): PCO2, PO2  CBC  Recent Labs Lab 08/02/14 2224  HGB 2.8*  HCT 9.9*  WBC 22.4*  PLT 207    COAGULATION  Recent Labs Lab 08/02/14 2224  INR 2.12*    CARDIAC  No results for input(s): TROPONINI in the last 168 hours. No results for input(s): PROBNP in the last 168 hours.   CHEMISTRY  Recent Labs Lab 08/02/14 2224  NA 123*  K 4.0  CL 85*  CO2 15*  GLUCOSE 144*  BUN 27*  CREATININE 1.25  CALCIUM 7.5*   CrCl cannot be calculated (Unknown ideal weight.).   LIVER  Recent Labs Lab 08/02/14 2224  AST 102*  ALT 155*  ALKPHOS 120*  BILITOT 2.7*  PROT 5.0*  ALBUMIN 2.3*  INR 2.12*     INFECTIOUS  Recent Labs Lab 08/02/14 2232  LATICACIDVEN 8.90*     ENDOCRINE CBG (last 3)  No results for input(s): GLUCAP in the last 72 hours.       IMAGING x48h No results found.  A Lactic acidosis due to volume/blood depletion Severe anemia - hgb 2.8gm% (was 7gm% in 2013)  REC - PRBC to get hgb > 7gm%; recheck lactate - call GI  - Check abg - if very acidotic or patient decompensates - to ICU - For now [facility] to evaluate patient    Dr. Kalman ShanMurali Franklin Baumbach, M.D., [facility].C.C.P Pulmonary and Critical Care Medicine Staff Physician [facility] Pager: [phone], If no answer or between  15:00h - 7:00h: call [phone]  08/03/2014 12:29 AM

## 2014-08-03 NOTE — Progress Notes (Signed)
Blood bank made aware for the need for 2 units PRBC and claimed to call once it is ready.

## 2014-08-03 NOTE — Progress Notes (Signed)
Physician made aware that blood bank is still crossmatching for pts blood. Physician to write order for emergency release. Will await call from blood bank to pick up blood. Pt remains stable will monitor closely.

## 2014-08-03 NOTE — Progress Notes (Signed)
Echocardiogram 2D Echocardiogram has been performed.  Dorothey BasemanReel, Aaleeyah Bias M 08/03/2014, 4:28 PM

## 2014-08-03 NOTE — Progress Notes (Signed)
[place] TEAM 1 - Stepdown/ICU TEAM Progress Note  Clayton Watson ZOX:[id number] DOB: March 18, 1961 DOA: 08/02/2014 PCP: Dorrene GermanAVBUERE,[name], MD  Admit HPI / Brief Narrative: History of Present Illness:This is a 53 y.o. year old WM PMHx Liver cirrhosis w/ hx/o being on [facility] transplant list, ETOH abuse, coagulopathy presenting with encephalopaty, anemia, lactic acidosis, coagulopathy. Primary history from patient's brother. Per report, patient has had a history of end-stage liver disease followed by [facility] transplant service. Was ruled out the transplant list approximately 1-1/2 years ago secondary to improving liver function. Per brother, patient was alcohol free for approximately 3-1/2 years. However, patient as well as brothers reports patient with a 2 week drinking binge 3-4 weeks ago. Patient has been alcohol free for at least last 8 days. No fevers or chills. Has been compliant with all medicines. Presented to the ER Tmax 97.9, heart rate in the 90s to 100s, respirations in the tens to 20s, blood pressure in the 100s to 110s, satting greater than 97% on room air. White blood cell count 22.4, hemoglobin 2.8, platelet count 207, creatinine 1.25, bicarbonate 15, INR 2.12. AST 102, ALC 155, alkaline phosphatase 120. Lactate 8.9. Urinalysis negative for infection. Hemoccult positive per ER physician. Paracentesis done by ER physician. Started on Vanc, Zosyn and ceftriaxone empirically. Head CT WNL. PCM curbsided. Would like pt to be tranfused and then lactate reassessed.    HPI/Subjective: 12/8 A/O x4. Patient states feels significantly improved with blood transfusion. This would not be a full note secondary to patient being admitted at 0600 today  Assessment/Plan: Encephalopathy -Most likely multifactorial to include, severe anemia hemoglobin at admission= 2.5, and toxic metabolites, infection (SBP?). -Clearing   -Ammonia WNL  -head CT WNL  -treat active issues as discussed  below  Anemia -Hemoglobin on admission = 2.5; after transfusion 2 units PRBC hemoglobin = 5.1  -Transfuse additional 2 units PRBC; unknown cardiac Lasix 20 mg between units  -hemoccult positive, now that patient is more stable consult GI -hold BB given BP  -Continue Protonix 40 mg  BID -no active hematemesis -defer octreotide, no signs or symptoms hemoptysis/hematoemesis.     Severe sepsis  -meets criteria on HR and WBC  -concern for GI etiology (SBP?) -cont vanc and zosyn  -blood and peritoneal cultures/studies obtained -trend WBC   lactic acidosis  -likely multifactorial w/ contributions of hepatic, infectious, hypoperfusive etiologies -Continue normal saline 100 ml/hr   Cardiomyopathy -Unknown if this is acute or chronic since this is patient's first echocardiogram here. -Patient's BP continues to be soft hold beta blocker -Hemoglobin goal> 8 (most likely will not be a maintain secondary to alcoholism, liver failure)   Cirrhosis  -Child-Pugh score C  -prior hx/o end stage disease  -s/p paracentesis at bedside  -hold lasix and aldactone   -GI consult  -likely not candidate for transplant list again given recent ETOH abuse   Coagulopathy -hepatic in etiology  -HOLD anticoagulation -Plts WNL   Portable vein thrombus -Patient's INR currently 2.12 without any anticoagulation.  Alcohol abuse -Obtain UDS -Place on CIWA protocol    Code Status: FULL Family Communication: no family present at time of exam Disposition Plan:   Consultants: NA    Procedure/Significant Events: 12/8 CT head without contrast;Mild chronic atrophy and small vessel ischemic changes. 12/8 echocardiogram;- LVEF=  65%- 70%. - (grade 1 diastolic dysfunction). - Ascending aorta: The ascending aorta was mildly dilated. - Mitral valve: Mild prolapse, involving posterior leaflet. There was mild regurgitation. - Left atrium: moderately dilated. -  Right ventricle: mildly  dilated - Right atrium: mildly dilated. 12/8 ultrasound abdomen;Cirrhosis with at least partial portal vein thrombosis. Ascites and Rt pleural effusion.   Culture 12/7 blood 2 pending 12/8 peritoneal pending 12/8 MRSA by PCR negative    Antibiotics: Zosyn 12/8>> Vancomycin 12/8>>   DVT prophylaxis: SCD   Devices    LINES / TUBES:      Continuous Infusions: . sodium chloride    . sodium chloride 100 mL/hr at 08/03/14 0209    Objective: VITAL SIGNS: Temp: 98 F (36.7 C) (12/08 0835) Temp Source: Oral (12/08 0820) BP: 111/53 mmHg (12/08 0835) Pulse Rate: 101 (12/08 0835) SPO2; FIO2:   Intake/Output Summary (Last 24 hours) at 08/03/14 1047 Last data filed at 08/03/14 0820  Gross per 24 hour  Intake    915 ml  Output    400 ml  Net    515 ml     Exam: General: A/O 4, NAD, No acute respiratory distress Lungs: Clear to auscultation bilaterally without wheezes or crackles Cardiovascular: Regular rate and rhythm without murmur gallop or rub normal S1 and S2 Abdomen: Nontender, extremely distended, soft, bowel sounds positive, no rebound, no ascites, no appreciable mass Extremities: No significant cyanosis, clubbing, or edema bilateral lower extremities  Data Reviewed: Basic Metabolic Panel:  Recent Labs Lab 08/02/14 2224  NA 123*  K 4.0  CL 85*  CO2 15*  GLUCOSE 144*  BUN 27*  CREATININE 1.25  CALCIUM 7.5*   Liver Function Tests:  Recent Labs Lab 08/02/14 2224  AST 102*  ALT 155*  ALKPHOS 120*  BILITOT 2.7*  PROT 5.0*  ALBUMIN 2.3*   No results for input(s): LIPASE, AMYLASE in the last 168 hours.  Recent Labs Lab 08/02/14 2224  AMMONIA 16   CBC:  Recent Labs Lab 08/02/14 2224 08/03/14 0240  WBC 22.4* 19.0*  NEUTROABS  --  14.8*  HGB 2.8* 2.5*  HCT 9.9* 8.8*  MCV 75.6* 74.6*  PLT 207 132*   Cardiac Enzymes: No results for input(s): CKTOTAL, CKMB, CKMBINDEX, TROPONINI in the last 168 hours. BNP (last 3 results) No  results for input(s): PROBNP in the last 8760 hours. CBG: No results for input(s): GLUCAP in the last 168 hours.  Recent Results (from the past 240 hour(s))  Gram stain     Status: None   Collection Time: 08/03/14 12:03 AM  Result Value Ref Range Status   Specimen Description PERITONEAL CAVITY FLUID  Final   Special Requests NONE  Final   Gram Stain   Final    RARE WBC PRESENT, PREDOMINANTLY PMN FEW WBC PRESENT, PREDOMINANTLY MONONUCLEAR NO ORGANISMS SEEN    Report Status 08/03/2014 FINAL  Final  MRSA PCR Screening     Status: None   Collection Time: 08/03/14  1:37 AM  Result Value Ref Range Status   MRSA by PCR NEGATIVE NEGATIVE Final    Comment:        The GeneXpert MRSA Assay (FDA approved for NASAL specimens only), is one component of a comprehensive MRSA colonization surveillance program. It is not intended to diagnose MRSA infection nor to guide or monitor treatment for MRSA infections.      Studies:  Recent x-ray studies have been reviewed in detail by the Attending Physician  Scheduled Meds:  Scheduled Meds: . feeding supplement (ENSURE COMPLETE)  237 mL Oral BID BM  . lidocaine (PF)      . pantoprazole (PROTONIX) IV  40 mg Intravenous Q12H  . piperacillin-tazobactam (ZOSYN)  IV  3.375 g Intravenous 3 times per day  . sodium chloride  1,000 mL Intravenous Once  . sodium chloride  3 mL Intravenous Q12H  . sodium chloride  3 mL Intravenous Q12H  . vancomycin  1,000 mg Intravenous Once  . vancomycin  1,000 mg Intravenous Q12H    Time spent on care of this patient: 40 mins   Drema Dallas , MD   [facility] Office  [phone] Pager [phone]  On-Call/Text Page:      [email or web address]      password [email or web address]  If 7PM-7AM, please contact night-coverage [email or web address] Password [email or web address] 08/03/2014, 10:47 AM   LOS: 1 day

## 2014-08-03 NOTE — ED Notes (Signed)
Admitting at bedside 

## 2014-08-03 NOTE — Progress Notes (Signed)
ANTIBIOTIC CONSULT NOTE - INITIAL  Pharmacy Consult for vancomycin and zosyn  Indication: sepsis  Allergies  Allergen Reactions  . Nsaids     Bleeding due to ulcer     Patient Measurements:   Adjusted Body Weight:   Vital Signs: Temp: 97.9 F (36.6 C) (12/08 0003) Temp Source: Rectal (12/08 0003) BP: 104/46 mmHg (12/08 0100) Pulse Rate: 101 (12/08 0100) Intake/Output from previous day:   Intake/Output from this shift:    Labs:  Recent Labs  08/02/14 2224  WBC 22.4*  HGB 2.8*  PLT 207  CREATININE 1.25   CrCl cannot be calculated (Unknown ideal weight.). No results for input(s): VANCOTROUGH, VANCOPEAK, VANCORANDOM, GENTTROUGH, GENTPEAK, GENTRANDOM, TOBRATROUGH, TOBRAPEAK, TOBRARND, AMIKACINPEAK, AMIKACINTROU, AMIKACIN in the last 72 hours.   Microbiology: No results found for this or any previous visit (from the past 720 hour(s)).  Medical History: Past Medical History  Diagnosis Date  . Cirrhosis of liver   . Ascitic fluid   . Alcoholism   . Anxiety   . History of stomach ulcers   . Insomnia     Medications:   Assessment:     53 yo alcoholic with 3 years sobriety recently began drinking again. Hx of cirrhosis and liver failure. Health declining significantly in last 3 week. Currently septic requiring vanc and zosyn for empiric coverage. 1st dose in ed at 0018 (zosyn) and 0054 (vanc)    Goal of Therapy:  vanc 15-4420mcg/ml    Plan:  vancomcyin 1gm q12h  Zosyn 3.375 q8  Janice CoffinEarl, Tharun Cappella Jonathan 08/03/2014,1:34 AM

## 2014-08-04 LAB — CBC WITH DIFFERENTIAL/PLATELET
Basophils Absolute: 0 10*3/uL (ref 0.0–0.1)
Basophils Relative: 0 % (ref 0–1)
EOS PCT: 0 % (ref 0–5)
Eosinophils Absolute: 0 10*3/uL (ref 0.0–0.7)
HCT: 19.7 % — ABNORMAL LOW (ref 39.0–52.0)
Hemoglobin: 6.5 g/dL — CL (ref 13.0–17.0)
Lymphocytes Relative: 4 % — ABNORMAL LOW (ref 12–46)
Lymphs Abs: 0.7 10*3/uL (ref 0.7–4.0)
MCH: 25.5 pg — ABNORMAL LOW (ref 26.0–34.0)
MCHC: 33 g/dL (ref 30.0–36.0)
MCV: 77.3 fL — ABNORMAL LOW (ref 78.0–100.0)
MONOS PCT: 9 % (ref 3–12)
Monocytes Absolute: 1.5 10*3/uL — ABNORMAL HIGH (ref 0.1–1.0)
NEUTROS PCT: 87 % — AB (ref 43–77)
Neutro Abs: 14.9 10*3/uL — ABNORMAL HIGH (ref 1.7–7.7)
Platelets: 89 10*3/uL — ABNORMAL LOW (ref 150–400)
RBC: 2.55 MIL/uL — AB (ref 4.22–5.81)
RDW: 16.5 % — AB (ref 11.5–15.5)
WBC: 17.1 10*3/uL — AB (ref 4.0–10.5)

## 2014-08-04 LAB — RAPID URINE DRUG SCREEN, HOSP PERFORMED
AMPHETAMINES: NOT DETECTED
BARBITURATES: NOT DETECTED
Benzodiazepines: NOT DETECTED
Cocaine: NOT DETECTED
OPIATES: NOT DETECTED
TETRAHYDROCANNABINOL: POSITIVE — AB

## 2014-08-04 LAB — COMPREHENSIVE METABOLIC PANEL
ALBUMIN: 2.4 g/dL — AB (ref 3.5–5.2)
ALT: 120 U/L — ABNORMAL HIGH (ref 0–53)
ANION GAP: 16 — AB (ref 5–15)
AST: 64 U/L — ABNORMAL HIGH (ref 0–37)
Alkaline Phosphatase: 146 U/L — ABNORMAL HIGH (ref 39–117)
BUN: 27 mg/dL — AB (ref 6–23)
CHLORIDE: 87 meq/L — AB (ref 96–112)
CO2: 19 mEq/L (ref 19–32)
CREATININE: 1.13 mg/dL (ref 0.50–1.35)
Calcium: 7.5 mg/dL — ABNORMAL LOW (ref 8.4–10.5)
GFR calc Af Amer: 84 mL/min — ABNORMAL LOW (ref >90)
GFR calc non Af Amer: 73 mL/min — ABNORMAL LOW (ref >90)
Glucose, Bld: 202 mg/dL — ABNORMAL HIGH (ref 70–99)
POTASSIUM: 4.4 meq/L (ref 3.7–5.3)
Sodium: 122 mEq/L — ABNORMAL LOW (ref 137–147)
Total Bilirubin: 4.5 mg/dL — ABNORMAL HIGH (ref 0.3–1.2)
Total Protein: 5.2 g/dL — ABNORMAL LOW (ref 6.0–8.3)

## 2014-08-04 LAB — PREPARE RBC (CROSSMATCH)

## 2014-08-04 LAB — MAGNESIUM: MAGNESIUM: 2.8 mg/dL — AB (ref 1.5–2.5)

## 2014-08-04 LAB — LACTIC ACID, PLASMA: Lactic Acid, Venous: 4.3 mmol/L — ABNORMAL HIGH (ref 0.5–2.2)

## 2014-08-04 MED ORDER — SODIUM CHLORIDE 0.9 % IV SOLN: Freq: Once | INTRAVENOUS | Status: DC

## 2014-08-04 NOTE — Progress Notes (Signed)
  [place] TEAM 1 - Stepdown/ICU TEAM  RN called to inform me pt was desiring to leave AMA.  I have never seen this pt.  I was on a different unit attending to a pt w/ an active GIB needing immediate attention.  RN informed me pt was alert and oriented / was not confused.  I informed RN that pt was free to leave AMA if this was the case but that I would not be able to report to his bedside at this time as I was attending to an acute issue elsewhere.  Lonia BloodJeffrey T. Gabryel Talamo, MD [facility] For Consults/Admissions - Flow Manager - [phone] Office  [phone] Pager [phone]  On-Call/Text Page:      [email or web address]      password [email or web address]

## 2014-08-04 NOTE — Progress Notes (Signed)
K. Schorr made aware of pt's hg 6.5. New orders received. Blood bank called and made aware of orders. Will continue to monitor.

## 2014-08-04 NOTE — Progress Notes (Signed)
Very insistent to go home, removed telemetry leads and requested the iv line to be removed,  RN and Charge RN explained to him the needs to be in the hosp. but refused.His mother talked to him on the phone. Md aware, pt. Signed out against med .advise. Left at around 9:38 am.

## 2014-08-05 LAB — TYPE AND SCREEN
ABO/RH(D): O POS
ANTIBODY SCREEN: NEGATIVE
DONOR AG TYPE: NEGATIVE
DONOR AG TYPE: NEGATIVE
DONOR AG TYPE: NEGATIVE
Donor AG Type: NEGATIVE
Donor AG Type: NEGATIVE
UNIT DIVISION: 0
Unit division: 0
Unit division: 0
Unit division: 0
Unit division: 0

## 2014-08-06 LAB — BODY FLUID CULTURE: Culture: NO GROWTH

## 2014-08-09 LAB — CULTURE, BLOOD (ROUTINE X 2)
CULTURE: NO GROWTH
Culture: NO GROWTH

## 2014-08-11 NOTE — Discharge Summary (Signed)
  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 year old male with significant past medical history of liver cirrhosis w/ hx/o being on [facility] transplant list, ETOH abuse, coagulopathy presenting with encephalopaty, anemia, lactic acidosis, coagulopathy. Primary history from patient's brother. Per report, patient has had a history of end-stage liver disease followed by [facility] transplant service. Was ruled out the transplant list approximately 1-1/2 years ago secondary to improving liver function. Per brother, patient was alcohol free for approximately 3-1/2 years. However, patient as well as brothers reports patient with a 2 week drinking binge 3-4 weeks ago. Patient has been alcohol free for at least last 8 days. No fevers or chills. Has been compliant with all medicines. Presented to the ER Tmax 97.9, heart rate in the 90s to 100s, respirations in the tens to 20s, blood pressure in the 100s to 110s, satting greater than 97% on room air. White blood cell count 22.4, hemoglobin 2.8, platelet count 207, creatinine 1.25, bicarbonate 15, INR 2.12. AST 102, ALC 155, alkaline phosphatase 120. Lactate 8.9. Urinalysis negative for infection. Hemoccult positive per ER physician. Paracentesis done by ER physician. Started on Vanc, Zosyn and ceftriaxone empirically. Head CT WNL. PCM curbsided. Would like pt to be tranfused and then lactate reassessed.   Hospital Course: I was never directly involved in the care of this patient.  Please see my note as per the day  he LEFT AMA, as follows: "RN called to inform me pt was desiring to leave AMA. I have never seen this pt. I was on a different unit attending to a pt w/ an active GIB needing immediate attention. RN informed me pt was alert and oriented / was not confused. I informed RN that pt was free to leave AMA if this was the case but that I would not be able to report to his bedside at this time as I was attending to an acute issue elsewhere."  08/11/2014, 7:04 PM   Lonia BloodJeffrey T. Eleftherios Dudenhoeffer, MD [facility] Office  [phone] Pager [phone]  On-Call/Text Page:      [email or web address]      password [email or web address]

## 2014-09-03 ENCOUNTER — Observation Stay (HOSPITAL_COMMUNITY)
Admission: EM | Admit: 2014-09-03 | Discharge: 2014-09-04 | Disposition: A | Discharge status: Home / Self Care | Payer: Medicare Other | Attending: Internal Medicine | Admitting: Internal Medicine

## 2014-09-03 ENCOUNTER — Encounter (HOSPITAL_COMMUNITY): Payer: Self-pay | Admitting: Emergency Medicine

## 2014-09-03 DIAGNOSIS — F419 Anxiety disorder, unspecified: Secondary | ICD-10-CM | POA: Diagnosis not present

## 2014-09-03 DIAGNOSIS — R899 Unspecified abnormal finding in specimens from other organs, systems and tissues: Secondary | ICD-10-CM

## 2014-09-03 DIAGNOSIS — E871 Hypo-osmolality and hyponatremia: Secondary | ICD-10-CM | POA: Diagnosis not present

## 2014-09-03 DIAGNOSIS — R188 Other ascites: Secondary | ICD-10-CM | POA: Diagnosis present

## 2014-09-03 DIAGNOSIS — K7031 Alcoholic cirrhosis of liver with ascites: Secondary | ICD-10-CM | POA: Diagnosis not present

## 2014-09-03 DIAGNOSIS — R739 Hyperglycemia, unspecified: Secondary | ICD-10-CM | POA: Diagnosis not present

## 2014-09-03 DIAGNOSIS — D649 Anemia, unspecified: Secondary | ICD-10-CM | POA: Diagnosis not present

## 2014-09-03 DIAGNOSIS — R109 Unspecified abdominal pain: Secondary | ICD-10-CM | POA: Diagnosis present

## 2014-09-03 DIAGNOSIS — Z87891 Personal history of nicotine dependence: Secondary | ICD-10-CM | POA: Diagnosis not present

## 2014-09-03 DIAGNOSIS — K652 Spontaneous bacterial peritonitis: Secondary | ICD-10-CM

## 2014-09-03 LAB — COMPREHENSIVE METABOLIC PANEL
ALBUMIN: 3.9 g/dL (ref 3.5–5.2)
ALT: 15 U/L (ref 0–53)
AST: 35 U/L (ref 0–37)
Alkaline Phosphatase: 67 U/L (ref 39–117)
Anion gap: 9 (ref 5–15)
BILIRUBIN TOTAL: 3.7 mg/dL — AB (ref 0.3–1.2)
BUN: 12 mg/dL (ref 6–23)
CHLORIDE: 101 meq/L (ref 96–112)
CO2: 22 mmol/L (ref 19–32)
Calcium: 9.3 mg/dL (ref 8.4–10.5)
Creatinine, Ser: 0.85 mg/dL (ref 0.50–1.35)
GFR calc Af Amer: >90 mL/min (ref >90)
Glucose, Bld: 222 mg/dL — ABNORMAL HIGH (ref 70–99)
POTASSIUM: 4.3 mmol/L (ref 3.5–5.1)
Sodium: 132 mmol/L — ABNORMAL LOW (ref 135–145)
Total Protein: 6.4 g/dL (ref 6.0–8.3)

## 2014-09-03 LAB — PROTIME-INR
INR: 1.64 — ABNORMAL HIGH (ref 0.00–1.49)
Prothrombin Time: 19.6 seconds — ABNORMAL HIGH (ref 11.6–15.2)

## 2014-09-03 LAB — CBC WITH DIFFERENTIAL/PLATELET
BASOS PCT: 0 % (ref 0–1)
Basophils Absolute: 0 10*3/uL (ref 0.0–0.1)
EOS ABS: 0.1 10*3/uL (ref 0.0–0.7)
Eosinophils Relative: 2 % (ref 0–5)
HEMATOCRIT: 28.6 % — AB (ref 39.0–52.0)
HEMOGLOBIN: 9.3 g/dL — AB (ref 13.0–17.0)
Lymphocytes Relative: 18 % (ref 12–46)
Lymphs Abs: 1 10*3/uL (ref 0.7–4.0)
MCH: 28.8 pg (ref 26.0–34.0)
MCHC: 32.5 g/dL (ref 30.0–36.0)
MCV: 88.5 fL (ref 78.0–100.0)
MONOS PCT: 5 % (ref 3–12)
Monocytes Absolute: 0.3 10*3/uL (ref 0.1–1.0)
NEUTROS ABS: 3.9 10*3/uL (ref 1.7–7.7)
Neutrophils Relative %: 75 % (ref 43–77)
PLATELETS: 62 10*3/uL — AB (ref 150–400)
RBC: 3.23 MIL/uL — ABNORMAL LOW (ref 4.22–5.81)
RDW: 21.4 % — ABNORMAL HIGH (ref 11.5–15.5)
WBC: 5.3 10*3/uL (ref 4.0–10.5)

## 2014-09-03 LAB — APTT: APTT: 34 s (ref 24–37)

## 2014-09-03 LAB — AMMONIA: Ammonia: 51 umol/L — ABNORMAL HIGH (ref 11–32)

## 2014-09-03 MED ORDER — FERROUS SULFATE 325 (65 FE) MG PO TABS
325.0000 mg | ORAL_TABLET | Freq: Every day | ORAL | Status: DC | PRN
  Filled 2014-09-03: qty 1

## 2014-09-03 MED ORDER — FUROSEMIDE 80 MG PO TABS
80.0000 mg | ORAL_TABLET | Freq: Two times a day (BID) | ORAL | Status: DC
  Filled 2014-09-03 (×3): qty 1

## 2014-09-03 MED ORDER — SODIUM CHLORIDE 0.9 % IJ SOLN: 3.0000 mL | INTRAMUSCULAR | Status: DC | PRN

## 2014-09-03 MED ORDER — ZOLPIDEM TARTRATE 5 MG PO TABS
10.0000 mg | ORAL_TABLET | Freq: Every day | ORAL | Status: DC
  Administered 2014-09-04: 10 mg via ORAL
  Filled 2014-09-03: qty 2

## 2014-09-03 MED ORDER — INSULIN ASPART 100 UNIT/ML ~~LOC~~ SOLN: 0.0000 [IU] | Freq: Three times a day (TID) | SUBCUTANEOUS | Status: DC

## 2014-09-03 MED ORDER — SODIUM CHLORIDE 0.9 % IV SOLN
INTRAVENOUS | Status: AC
  Administered 2014-09-03: via INTRAVENOUS

## 2014-09-03 MED ORDER — PANTOPRAZOLE SODIUM 40 MG PO TBEC: 40.0000 mg | DELAYED_RELEASE_TABLET | Freq: Every day | ORAL | Status: DC

## 2014-09-03 MED ORDER — SPIRONOLACTONE 100 MG PO TABS
100.0000 mg | ORAL_TABLET | Freq: Three times a day (TID) | ORAL | Status: DC
  Filled 2014-09-03 (×2): qty 1

## 2014-09-03 MED ORDER — SODIUM CHLORIDE 0.9 % IJ SOLN: 3.0000 mL | Freq: Two times a day (BID) | INTRAMUSCULAR | Status: DC

## 2014-09-03 MED ORDER — SODIUM CHLORIDE 0.9 % IJ SOLN
3.0000 mL | Freq: Two times a day (BID) | INTRAMUSCULAR | Status: DC
  Administered 2014-09-03: 3 mL via INTRAVENOUS

## 2014-09-03 MED ORDER — ADULT MULTIVITAMIN W/MINERALS CH
1.0000 | ORAL_TABLET | Freq: Every day | ORAL | Status: DC
  Filled 2014-09-03: qty 1

## 2014-09-03 MED ORDER — SODIUM CHLORIDE 0.9 % IV SOLN: 250.0000 mL | INTRAVENOUS | Status: DC | PRN

## 2014-09-03 NOTE — ED Notes (Signed)
Hospitalist at bedside 

## 2014-09-03 NOTE — ED Provider Notes (Signed)
CSN: [id number]     Arrival date & time 09/03/14  1705 History   First MD Initiated Contact with Patient 09/03/14 2034     Chief Complaint  Patient presents with  . 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 Prereduction report that he was [facility] and discharged on January 6 which is 2 days ago for treatment of a severe GI bleed in which his hemoglobin was 2. Patient states he felt much better after being discharged however his doctor called today to notify to him that they find a coagulase-negative staph in his ascites fluid. They suspect that this could be a contaminant however they recommend him to [facility] to request a repeat tap to ensure that he does have evidence of SBP. Patient also mentioned that he has a 44% unucleated and 3% NPMN from the result of his tap.   currently denies having any fever, chest pain, shortness of breath, worsening abdominal pain, nausea vomiting diarrhea. Denies any hematemesis, hematochezia or melena.  Past Medical History  Diagnosis Date  . Cirrhosis of liver   . Ascitic fluid   . Alcoholism   . Anxiety   . History of stomach ulcers   . Insomnia    Past Surgical History  Procedure Laterality Date  . Appendectomy    . Hernia repair      inguinal hernia x 2   Family History  Problem Relation Age of Onset  . Colon cancer Neg Hx   . Liver disease Brother     Hep C-Blood Transfusion    History  Substance Use Topics  . Smoking status: Former Smoker -- 0.00 packs/day    Quit date: 12/26/2011  . Smokeless tobacco: Never Used  . Alcohol Use: No    Review of Systems  All other systems reviewed and are negative.     Allergies  Nsaids  Home Medications   Prior to  Admission medications   Medication Sig Start Date End Date Taking? Authorizing Provider  ferrous sulfate 325 (65 FE) MG tablet Take 325 mg by mouth daily as needed (for anemia).    Historical Provider, MD  furosemide (LASIX) 80 MG tablet Take 1 tablet (80 mg total) by mouth 2 (two) times daily. 03/03/13   Roxy Horsemanobert Browning, PA-C  HYDROcodone-acetaminophen (NORCO/VICODIN) 5-325 MG per tablet Take 1 tablet by mouth every 6 (six) hours as needed for moderate pain. Patient not taking: Reported on 08/02/2014 02/25/14   Jamesetta Orleanshristopher W Lawyer, PA-C  Multiple Vitamin (MULITIVITAMIN WITH MINERALS) TABS Take 1 tablet by mouth daily.    Historical Provider, MD  omeprazole (PRILOSEC) 20 MG capsule Take 1 capsule (20 mg total) by mouth daily. Patient not taking: Reported on 08/02/2014 03/03/13   Roxy Horsemanobert Browning, PA-C  spironolactone (ALDACTONE) 100 MG tablet Take 1 tablet (100 mg total) by mouth 4 (four) times daily. 03/03/13   Roxy Horsemanobert Browning, PA-C  zolpidem (AMBIEN) 10 MG tablet Take 1 tablet (10 mg total) by mouth at bedtime. 03/03/13   Roxy Horsemanobert Browning, PA-C   BP 105/70 mmHg  Pulse 94  Temp(Src) 98.1 F (36.7 C) (Oral)  Resp 13  SpO2 99% Physical Exam  Constitutional: He is oriented to person, place, and time.  Caucasian male appears to be of stated age, with sparse  hair, skin is pale and mildly jaundice in appearance. He appears to be no acute distress.  Cardiovascular: Normal rate and regular rhythm.   Pulmonary/Chest: Effort normal and breath sounds normal.  Abdominal: Soft.  Distended abdomen that is soft and mildly tender diffusely on palpation.  Genitourinary:  Left inguinal hernia noted that is reducible without any significant tenderness.  Musculoskeletal: He exhibits no edema.  Neurological: He is alert and oriented to person, place, and time.  Skin:  Telectasic skin changes noted throughout body  Nursing note and vitals reviewed.   ED Course  Procedures (including critical care time)  9:12  PM Pt with an extensive hospital stay at [facility] last month for treatment of GI bleed and released 2 days ago.  ascite fluid with coagulase (-) staph, likely a contaminant but his doctor recommend to come to [facility] for a recheck of his ascites fluid.  We performed a FAST exam [place] which shows minimal fluid, and not likely to successfully obtain in ER.  I have consulted with our [facility] Dr. Selena Batten who agrees to admits pt to obs, tele in order to have IR to perform a tap tomorrow to ensure no evidence of SBP.  Pt without fever, and minimal abdominal tenderness on exam.  Low suspicion for SBP at this time  Care discussed with Dr. Hyacinth Meeker.  Pt agrees with plan.    Labs Review Labs Reviewed  CBC WITH DIFFERENTIAL - Abnormal; Notable for the following:    RBC 3.23 (*)    Hemoglobin 9.3 (*)    HCT 28.6 (*)    RDW 21.4 (*)    Platelets 62 (*)    All other components within normal limits  COMPREHENSIVE METABOLIC PANEL - Abnormal; Notable for the following:    Sodium 132 (*)    Glucose, Bld 222 (*)    Total Bilirubin 3.7 (*)    All other components within normal limits  PROTIME-INR - Abnormal; Notable for the following:    Prothrombin Time 19.6 (*)    INR 1.64 (*)    All other components within normal limits  AMMONIA - Abnormal; Notable for the following:    Ammonia 51 (*)    All other components within normal limits  APTT    Imaging Review No results found.   EKG Interpretation None      MDM   Final diagnoses:  Abnormal laboratory test result   BP 105/70 mmHg  Pulse 94  Temp(Src) 98.1 F (36.7 C) (Oral)  Resp 13  SpO2 99%  I have reviewed nursing notes and vital signs. I reviewed available ER/hospitalization records thought the EMR     Fayrene Helper, PA-C 09/03/14 2215  Vida Roller, MD 09/04/14 [phone]

## 2014-09-03 NOTE — ED Notes (Signed)
EDP at bedside  

## 2014-09-03 NOTE — ED Provider Notes (Signed)
54 year old male, history of cirrhotic liver failure, presents with a complaint of the need for paracentesis after he states that he was discharged from another hospital and was called today letting him know that he had positive cultures for paracentesis fluid with  coagulase-negative staph. The patient states that he does have abdominal pain, he does feel generally weak but denies any fevers or chills. he was told to come to the hospital for a repeat paracentesis.  On exam, he has mild diffuse abdominal tenderness with mild guarding, bedside ultrasound shows that he does have free fluid but this is only mild to moderate in amount, he has normal heart and lung sounds, no significant peripheral edema, he does not appear dyspneic or short of breath.  Ultrasound limited abdominal and limited transthoracic ultrasound (FAST)  Indication: Free fluid - abd pain Four views were obtained using the low frequency transducer: Splenorenal, Hepatorenal, Retrovesical, Pericardial subxyphoid Interpretation:  free fluid visualized surrounding the kidneys. Images archiv ed electronically Dr. Hyacinth MeekerMiller personally performed and interpreted the images  Medical screening examination/treatment/procedure(s) were conducted as a shared visit with non-physician practitioner(s) and myself.  I personally evaluated the patient during the encounter.  Clinical Impression:   Final diagnoses:  Abnormal laboratory test result  Abdominal pain, unspecified abdominal location            Vida RollerBrian D Avamae Dehaan, MD 09/04/14 [phone]

## 2014-09-03 NOTE — ED Notes (Signed)
Pt sent here by [facility] to have pericentrics to r/o bacteria in fluid; no other complaint

## 2014-09-03 NOTE — H&P (Addendum)
Clayton Watson is an 54 y.o. male.    Pcp:  Dr. Warren Danes  GI:  Weldon Inches  Chief Complaint: ascites, ? sbp vs contaminant  HPI: 54 yo male with hx of cirrhosis and ascites apparently recently admitted to [facility] for ascites and abdominal discomfort, apparently had paracentesis and culture grew out staph ?.  Pt was contacted and told to go to ED for evaluation.  Pt states that he has chronic abdominal pain but this is not new,  Pt states that pain is diffuse, pt has been treated with bactrim, and denies fever, chills, n/v, diarrhea, brbpr, black stool.    Past Medical History  Diagnosis Date  . Cirrhosis of liver   . Ascitic fluid   . Alcoholism   . Anxiety   . History of stomach ulcers   . Insomnia     Past Surgical History  Procedure Laterality Date  . Appendectomy    . Hernia repair      inguinal hernia x 2    Family History  Problem Relation Age of Onset  . Colon cancer Neg Hx   . Liver disease Brother     Hep C-Blood Transfusion    Social History:  reports that he quit smoking about 2 years ago. He has never used smokeless tobacco. He reports that he does not drink alcohol or use illicit drugs.  Allergies:  Allergies  Allergen Reactions  . Nsaids     Bleeding due to ulcer      (Not in a hospital admission)  Results for orders placed or performed during the hospital encounter of 09/03/14 (from the past 48 hour(s))  CBC with Differential     Status: Abnormal   Collection Time: 09/03/14  8:01 PM  Result Value Ref Range   WBC 5.3 4.0 - 10.5 K/uL   RBC 3.23 (L) 4.22 - 5.81 MIL/uL   Hemoglobin 9.3 (L) 13.0 - 17.0 g/dL   HCT 28.6 (L) 39.0 - 52.0 %   MCV 88.5 78.0 - 100.0 fL   MCH 28.8 26.0 - 34.0 pg   MCHC 32.5 30.0 - 36.0 g/dL   RDW 21.4 (H) 11.5 - 15.5 %   Platelets 62 (L) 150 - 400 K/uL    Comment: REPEATED TO VERIFY SPECIMEN CHECKED FOR CLOTS PLATELETS APPEAR DECREASED PLATELET COUNT CONFIRMED BY SMEAR    Neutrophils Relative % 75 43 - 77 %    Lymphocytes Relative 18 12 - 46 %   Monocytes Relative 5 3 - 12 %   Eosinophils Relative 2 0 - 5 %   Basophils Relative 0 0 - 1 %   Neutro Abs 3.9 1.7 - 7.7 K/uL   Lymphs Abs 1.0 0.7 - 4.0 K/uL   Monocytes Absolute 0.3 0.1 - 1.0 K/uL   Eosinophils Absolute 0.1 0.0 - 0.7 K/uL   Basophils Absolute 0.0 0.0 - 0.1 K/uL   RBC Morphology POLYCHROMASIA PRESENT     Comment: FEW TEAR DROP CELLS AND ELLIPTOCYTES NOTED  Comprehensive metabolic panel     Status: Abnormal   Collection Time: 09/03/14  8:01 PM  Result Value Ref Range   Sodium 132 (L) 135 - 145 mmol/L    Comment: Please note change in reference range.   Potassium 4.3 3.5 - 5.1 mmol/L    Comment: Please note change in reference range.   Chloride 101 96 - 112 mEq/L   CO2 22 19 - 32 mmol/L   Glucose, Bld 222 (H) 70 - 99 mg/dL  BUN 12 6 - 23 mg/dL   Creatinine, Ser 0.85 0.50 - 1.35 mg/dL   Calcium 9.3 8.4 - 10.5 mg/dL   Total Protein 6.4 6.0 - 8.3 g/dL   Albumin 3.9 3.5 - 5.2 g/dL   AST 35 0 - 37 U/L   ALT 15 0 - 53 U/L   Alkaline Phosphatase 67 39 - 117 U/L   Total Bilirubin 3.7 (H) 0.3 - 1.2 mg/dL   GFR calc non Af Amer >90 >90 mL/min   GFR calc Af Amer >90 >90 mL/min    Comment: (NOTE) The eGFR has been calculated using the CKD EPI equation. This calculation has not been validated in all clinical situations. eGFR's persistently <90 mL/min signify possible Chronic Kidney Disease.    Anion gap 9 5 - 15  Protime-INR     Status: Abnormal   Collection Time: 09/03/14  8:01 PM  Result Value Ref Range   Prothrombin Time 19.6 (H) 11.6 - 15.2 seconds   INR 1.64 (H) 0.00 - 1.49  APTT     Status: None   Collection Time: 09/03/14  8:01 PM  Result Value Ref Range   aPTT 34 24 - 37 seconds  Ammonia     Status: Abnormal   Collection Time: 09/03/14  8:02 PM  Result Value Ref Range   Ammonia 51 (H) 11 - 32 umol/L    Comment: Please note change in reference range.   No results found.  Review of Systems  Constitutional: Negative  for fever, chills, weight loss, malaise/fatigue and diaphoresis.  HENT: Negative for congestion, ear discharge, ear pain, hearing loss, nosebleeds, sore throat and tinnitus.   Eyes: Negative for blurred vision, double vision, photophobia, pain, discharge and redness.  Respiratory: Negative for cough, hemoptysis, sputum production, shortness of breath, wheezing and stridor.   Cardiovascular: Negative for chest pain, palpitations, orthopnea, claudication, leg swelling and PND.  Gastrointestinal: Positive for abdominal pain. Negative for heartburn, nausea, vomiting, diarrhea, constipation, blood in stool and melena.       Constant, no change per pt  Genitourinary: Negative for dysuria, urgency, frequency, hematuria and flank pain.  Musculoskeletal: Negative for myalgias, back pain, joint pain, falls and neck pain.  Skin: Negative for itching and rash.  Neurological: Negative for dizziness, tingling, tremors, sensory change, speech change, focal weakness, seizures, loss of consciousness, weakness and headaches.  Endo/Heme/Allergies: Negative for environmental allergies and polydipsia. Does not bruise/bleed easily.  Psychiatric/Behavioral: Negative for depression, suicidal ideas, hallucinations, memory loss and substance abuse. The patient is not nervous/anxious and does not have insomnia.     Blood pressure 101/74, pulse 94, temperature 98.1 F (36.7 C), temperature source Oral, resp. rate 22, SpO2 99 %. Physical Exam  Constitutional: He is oriented to person, place, and time. He appears well-developed and well-nourished.  HENT:  Head: Normocephalic and atraumatic.  Eyes: Conjunctivae and EOM are normal. Pupils are equal, round, and reactive to light. No scleral icterus.  Neck: Normal range of motion. Neck supple. No JVD present. No tracheal deviation present. No thyromegaly present.  Cardiovascular: Normal rate and regular rhythm.  Exam reveals no gallop and no friction rub.   No murmur  heard. Respiratory: Effort normal and breath sounds normal. No respiratory distress. He has no wheezes. He has no rales.  GI: Soft. Bowel sounds are normal. He exhibits distension. There is no tenderness. There is no rebound and no guarding.  Minimal distention, evidence of prior paracentesis,    Musculoskeletal: Normal range of motion. He exhibits  no edema or tenderness.  Lymphadenopathy:    He has no cervical adenopathy.  Neurological: He is alert and oriented to person, place, and time. He has normal reflexes. He displays normal reflexes. No cranial nerve deficit. He exhibits normal muscle tone. Coordination normal.  Skin: Skin is warm and dry. No rash noted. No erythema. No pallor.  + palmar erythema  Psychiatric: He has a normal mood and affect. His behavior is normal. Judgment and thought content normal.     Assessment/Plan Ascites [facility] suspects contaminant U/s guided paracentesis tomorrow to r/o sbp Cont bactrim ds 1 po bid  Anemia chek cbc in am to ensure stability of hemoglobin  Hyperglycemia Check hga1c, fsbs ac and qhs, iss  Hyponatremia Check cmp in am If increasing hyponatremia  Consider further w/up with serum osm, tsh , cortisol, urine osm, urine sodium   Rhema Boyett 09/03/2014, 10:36 PM

## 2014-09-03 NOTE — ED Notes (Signed)
Advised of the wait time 

## 2014-09-04 ENCOUNTER — Observation Stay (HOSPITAL_COMMUNITY): Payer: Medicare Other

## 2014-09-04 ENCOUNTER — Encounter (HOSPITAL_COMMUNITY): Payer: Self-pay | Admitting: *Deleted

## 2014-09-04 DIAGNOSIS — K7031 Alcoholic cirrhosis of liver with ascites: Secondary | ICD-10-CM

## 2014-09-04 LAB — BODY FLUID CELL COUNT WITH DIFFERENTIAL
Eos, Fluid: 1 %
Lymphs, Fluid: 29 %
Monocyte-Macrophage-Serous Fluid: 68 % (ref 50–90)
NEUTROPHIL FLUID: 2 % (ref 0–25)
WBC FLUID: 292 uL (ref 0–1000)

## 2014-09-04 LAB — CBC WITH DIFFERENTIAL/PLATELET
BASOS ABS: 0 10*3/uL (ref 0.0–0.1)
Basophils Relative: 0 % (ref 0–1)
Eosinophils Absolute: 0.1 10*3/uL (ref 0.0–0.7)
Eosinophils Relative: 3 % (ref 0–5)
HCT: 26.5 % — ABNORMAL LOW (ref 39.0–52.0)
Hemoglobin: 8.3 g/dL — ABNORMAL LOW (ref 13.0–17.0)
Lymphocytes Relative: 15 % (ref 12–46)
Lymphs Abs: 0.7 10*3/uL (ref 0.7–4.0)
MCH: 27.2 pg (ref 26.0–34.0)
MCHC: 31.3 g/dL (ref 30.0–36.0)
MCV: 86.9 fL (ref 78.0–100.0)
Monocytes Absolute: 0.6 10*3/uL (ref 0.1–1.0)
Monocytes Relative: 14 % — ABNORMAL HIGH (ref 3–12)
Neutro Abs: 3.1 10*3/uL (ref 1.7–7.7)
Neutrophils Relative %: 68 % (ref 43–77)
PLATELETS: 55 10*3/uL — AB (ref 150–400)
RBC: 3.05 MIL/uL — AB (ref 4.22–5.81)
RDW: 21.5 % — ABNORMAL HIGH (ref 11.5–15.5)
WBC: 4.5 10*3/uL (ref 4.0–10.5)

## 2014-09-04 LAB — COMPREHENSIVE METABOLIC PANEL
ALBUMIN: 3.5 g/dL (ref 3.5–5.2)
ALT: 14 U/L (ref 0–53)
AST: 31 U/L (ref 0–37)
Alkaline Phosphatase: 55 U/L (ref 39–117)
Anion gap: 7 (ref 5–15)
BUN: 10 mg/dL (ref 6–23)
CHLORIDE: 105 meq/L (ref 96–112)
CO2: 20 mmol/L (ref 19–32)
Calcium: 8.9 mg/dL (ref 8.4–10.5)
Creatinine, Ser: 0.87 mg/dL (ref 0.50–1.35)
GFR calc non Af Amer: >90 mL/min (ref >90)
GLUCOSE: 99 mg/dL (ref 70–99)
POTASSIUM: 4.2 mmol/L (ref 3.5–5.1)
Sodium: 132 mmol/L — ABNORMAL LOW (ref 135–145)
TOTAL PROTEIN: 6 g/dL (ref 6.0–8.3)
Total Bilirubin: 3.3 mg/dL — ABNORMAL HIGH (ref 0.3–1.2)

## 2014-09-04 LAB — HEMOGLOBIN A1C
HEMOGLOBIN A1C: 5.3 % (ref <5.7)
Mean Plasma Glucose: 105 mg/dL (ref <117)

## 2014-09-04 LAB — PROTEIN, BODY FLUID: Total protein, fluid: <3 g/dL

## 2014-09-04 LAB — GLUCOSE, CAPILLARY: GLUCOSE-CAPILLARY: 110 mg/dL — AB (ref 70–99)

## 2014-09-04 LAB — ALBUMIN, FLUID (OTHER)

## 2014-09-04 MED ORDER — LIDOCAINE HCL (PF) 1 % IJ SOLN
INTRAMUSCULAR | Status: AC
  Filled 2014-09-04: qty 10

## 2014-09-04 NOTE — Progress Notes (Signed)
Utilization Review completed.  

## 2014-09-04 NOTE — Procedures (Signed)
Successful US guided paracentesis from [facility].  Yielded 1.4 liters of clear serous fluid.  No immediate complications.  Pt tolerated well.   Specimen was sent for labs.  Pattricia BossMORGAN, Beverly Suriano D PA-C 09/04/2014 10:29 AM

## 2014-09-04 NOTE — Discharge Summary (Signed)
Discharge Summary  Clayton Watson ZOX:[id number] DOB: 1961/08/08  PCP: Clayton German, MD  Admit date: 09/03/2014 Discharge date: 09/04/2014  Time spent: >47mins  Recommendations for Outpatient Follow-up:  1. [facility]/hepatology, patient reported has appointment with Dr .Clayton Watson [phone]) at [facility] on 2/10, patient also reported he is also followed by [facility] liver transplant team 2. pmd on 1/18, per patient's report  Discharge Diagnoses:  Active Hospital Problems   Diagnosis Date Noted  . Ascites   . Abnormal laboratory test result 09/03/2014  . Anemia 08/03/2014  . Alcoholic cirrhosis of liver with ascites     Resolved Hospital Problems   Diagnosis Date Noted Date Resolved  No resolved problems to display.    Discharge Condition: stable  Diet recommendation: regular diet  Filed Weights   09/04/14 0020  Weight: 68.6 kg (151 lb 3.8 oz)    History of present illness:  54 yo male with hx of cirrhosis and ascites recently discharged on 1/6 from [facility] for ascites and abdominal discomfort, apparently had paracentesis and culture grew out staph ?. Pt was instructed to come to Ed to have another diagnostic paracentesis. Pt states that he has chronic abdominal pain but this is not new, Pt states that pain is diffuse, pt has been taking bactrim daily since discharged from [facility] on 1/6. and denies fever, chills, n/v, diarrhea, brbpr, black stool.   Hospital Course:  Principal Problem:   Ascites Active Problems:   Anemia   Alcoholic cirrhosis of liver with ascites   Abnormal laboratory test result patient currently denies any pain, vital stable, no fever, no n/v. Labs at baseline. No leukocytosis. Patient adamantly want to be discharge home after paracentesis procedure, he does not want to wait for the ascetic fluids cell count result. i explained to patient at length about the risk of sbp, he still insist on leaving the hospital. He reported already has follow up appointment  with his pmd and [facility]. i asked him to leave his personal contact number, should his cell count result is concerning will contact patient. Instructed him to continue take bactrim.  Patient reported his last ascetic fluids cell count with 44% nucleated cells.  Procedures:  Diagnostic paracentesis  Consultations:  none  Discharge Exam: BP 106/64 mmHg  Pulse 91  Temp(Src) 98.8 F (37.1 C) (Oral)  Resp 20  Ht  (1.753 m)  Wt 68.6 kg (151 lb 3.8 oz)  BMI 22.32 kg/m2  SpO2 91%  General: NAD Cardiovascular: RRR Respiratory: CTABL Ab: mild distension, nontender, no rebound. Patient report this is at his baseline Extremity:no edema Skin, no juandice  Discharge Instructions You were cared for by a hospitalist during your hospital stay. If you have any questions about your discharge medications or the care you received while you were in the hospital after you are discharged, you can call the unit and asked to speak with the hospitalist on call if the hospitalist that took care of you is not available. Once you are discharged, your primary care physician will handle any further medical issues. Please note that NO REFILLS for any discharge medications will be authorized once you are discharged, as it is imperative that you return to your primary care physician (or establish a relationship with a primary care physician if you do not have one) for your aftercare needs so that they can reassess your need for medications and monitor your lab values.  Discharge Instructions    Diet - low sodium heart healthy  Complete by:  As directed      Increase activity slowly    Complete by:  As directed             Medication List    TAKE these medications        ferrous sulfate 325 (65 FE) MG tablet  Take 325 mg by mouth daily as needed (for anemia).     furosemide 80 MG tablet  Commonly known as:  LASIX  Take 1 tablet (80 mg total) by mouth 2 (two) times daily.      HYDROcodone-acetaminophen 5-325 MG per tablet  Commonly known as:  NORCO/VICODIN  Take 1 tablet by mouth every 6 (six) hours as needed for moderate pain.     multivitamin with minerals Tabs tablet  Take 1 tablet by mouth daily.     omeprazole 20 MG capsule  Commonly known as:  PRILOSEC  Take 1 capsule (20 mg total) by mouth daily.     spironolactone 100 MG tablet  Commonly known as:  ALDACTONE  Take 1 tablet (100 mg total) by mouth 4 (four) times daily.     zolpidem 10 MG tablet  Commonly known as:  AMBIEN  Take 1 tablet (10 mg total) by mouth at bedtime.       Allergies  Allergen Reactions  . Nsaids     Bleeding due to ulcer        Follow-up Information    Follow up with Clayton GermanAVBUERE,Clayton A, MD.   Specialty:  Internal Medicine   Contact information:   [address] [place] [address] [phone]        The results of significant diagnostics from this hospitalization (including imaging, microbiology, ancillary and laboratory) are listed below for reference.    Significant Diagnostic Studies: No results found.  Microbiology: No results found for this or any previous visit (from the past 240 hour(s)).   Labs: Basic Metabolic Panel:  Recent Labs Lab 09/03/14 2001 09/04/14 0455  NA 132* 132*  K 4.3 4.2  CL 101 105  CO2 22 20  GLUCOSE 222* 99  BUN 12 10  CREATININE 0.85 0.87  CALCIUM 9.3 8.9   Liver Function Tests:  Recent Labs Lab 09/03/14 2001 09/04/14 0455  AST 35 31  ALT 15 14  ALKPHOS 67 55  BILITOT 3.7* 3.3*  PROT 6.4 6.0  ALBUMIN 3.9 3.5   No results for input(s): LIPASE, AMYLASE in the last 168 hours.  Recent Labs Lab 09/03/14 2002  AMMONIA 51*   CBC:  Recent Labs Lab 09/03/14 2001 09/04/14 0455  WBC 5.3 4.5  NEUTROABS 3.9 3.1  HGB 9.3* 8.3*  HCT 28.6* 26.5*  MCV 88.5 86.9  PLT 62* 55*   Cardiac Enzymes: No results for input(s): CKTOTAL, CKMB, CKMBINDEX, TROPONINI in the last 168 hours. BNP: BNP (last 3  results) No results for input(s): PROBNP in the last 8760 hours. CBG:  Recent Labs Lab 09/04/14 0752  GLUCAP 110*       Signed:  [name]  [facility] 09/04/2014, 11:34 AM

## 2014-09-06 LAB — PATHOLOGIST SMEAR REVIEW: Path Review: REACTIVE

## 2014-09-08 LAB — BODY FLUID CULTURE
CULTURE: NO GROWTH
GRAM STAIN: NONE SEEN

## 2014-09-09 LAB — ANAEROBIC CULTURE: GRAM STAIN: NONE SEEN

## 2015-08-13 ENCOUNTER — Emergency Department (HOSPITAL_COMMUNITY)
Admission: EM | Admit: 2015-08-13 | Discharge: 2015-08-13 | Disposition: A | Discharge status: Home / Self Care | Payer: Medicare Other | Attending: Emergency Medicine | Admitting: Emergency Medicine

## 2015-08-13 ENCOUNTER — Encounter (HOSPITAL_COMMUNITY): Payer: Self-pay

## 2015-08-13 DIAGNOSIS — F919 Conduct disorder, unspecified: Secondary | ICD-10-CM | POA: Insufficient documentation

## 2015-08-13 DIAGNOSIS — Z8659 Personal history of other mental and behavioral disorders: Secondary | ICD-10-CM | POA: Insufficient documentation

## 2015-08-13 DIAGNOSIS — Z8719 Personal history of other diseases of the digestive system: Secondary | ICD-10-CM

## 2015-08-13 DIAGNOSIS — Z9089 Acquired absence of other organs: Secondary | ICD-10-CM | POA: Diagnosis not present

## 2015-08-13 DIAGNOSIS — R110 Nausea: Secondary | ICD-10-CM | POA: Diagnosis present

## 2015-08-13 DIAGNOSIS — R451 Restlessness and agitation: Secondary | ICD-10-CM | POA: Insufficient documentation

## 2015-08-13 DIAGNOSIS — G4700 Insomnia, unspecified: Secondary | ICD-10-CM | POA: Insufficient documentation

## 2015-08-13 DIAGNOSIS — Z79899 Other long term (current) drug therapy: Secondary | ICD-10-CM | POA: Diagnosis not present

## 2015-08-13 DIAGNOSIS — Z87891 Personal history of nicotine dependence: Secondary | ICD-10-CM | POA: Diagnosis not present

## 2015-08-13 NOTE — ED Provider Notes (Addendum)
Patient is a 54 year old male with history of cirrhosis. He was brought by EMS for evaluation of nausea and "feeling like I'm dying". Patient was initially evaluated by the mid-level provider, however he informed her that he did not want her caring for him and preferred to see a doctor. Several minutes after this encounter, I went to the patient's room to evaluate him. I introduced myself and asked him what brought him to the emergency department. His reply was "can't you just read my record? I already told this story several times." I explained to him that I needed to hear from him what his issues were so that I could properly care for him. He informed me that he could not speak because he has an abrasion to the back of his tongue. He then said he wanted a doctor to see him that would "listen". I informed him several times that I was here to help him but I needed more information to better care for him. He became extremely rude and belittling toward me. At this point I informed him that I was going to see other patients. I told him that if he decided he wanted my help, and could speak to me in a civil manner, I would return to the room to care for him.  Clayton Lyonsouglas Tyshawn Ciullo, MD 08/13/15 [id number]  Clayton Lyonsouglas Calvary Difranco, MD 08/13/15 [phone]

## 2015-08-13 NOTE — ED Provider Notes (Signed)
CSN: [id number]     Arrival date & time 08/13/15  [id number] History   First MD Initiated Contact with Patient 08/13/15 [phone]     Chief Complaint  Patient presents with  . Nausea     (Consider location/radiation/quality/duration/timing/severity/associated sxs/prior Treatment) HPI Comments: Patient is a 54 year old male with history of liver cirrhosis. He presents for evaluation of nausea and "feeling like he is wanted to die". He describes no other symptoms and refuses to provide more history than this.  The history is provided by the patient.    Past Medical History  Diagnosis Date  . Cirrhosis of liver (HCC)   . Ascitic fluid   . Alcoholism (HCC)   . Anxiety   . History of stomach ulcers   . Insomnia    Past Surgical History  Procedure Laterality Date  . Appendectomy    . Hernia repair      inguinal hernia x 2   Family History  Problem Relation Age of Onset  . Colon cancer Neg Hx   . Liver disease Brother     Hep C-Blood Transfusion    Social History  Substance Use Topics  . Smoking status: Former Smoker -- 0.00 packs/day    Quit date: 12/26/2011  . Smokeless tobacco: Never Used  . Alcohol Use: No    Review of Systems  Unable to perform ROS: Other      Allergies  Nsaids  Home Medications   Prior to Admission medications   Medication Sig Start Date End Date Taking? Authorizing Provider  ferrous sulfate 325 (65 FE) MG tablet Take 325 mg by mouth daily as needed (for anemia).    Historical Provider, MD  furosemide (LASIX) 80 MG tablet Take 1 tablet (80 mg total) by mouth 2 (two) times daily. 03/03/13   Roxy Horsemanobert Browning, PA-C  HYDROcodone-acetaminophen (NORCO/VICODIN) 5-325 MG per tablet Take 1 tablet by mouth every 6 (six) hours as needed for moderate pain. Patient not taking: Reported on 08/02/2014 02/25/14   Charlestine Nighthristopher Lawyer, PA-C  Multiple Vitamin (MULITIVITAMIN WITH MINERALS) TABS Take 1 tablet by mouth daily.    Historical Provider, MD  omeprazole (PRILOSEC) 20  MG capsule Take 1 capsule (20 mg total) by mouth daily. Patient not taking: Reported on 08/02/2014 03/03/13   Roxy Horsemanobert Browning, PA-C  spironolactone (ALDACTONE) 100 MG tablet Take 1 tablet (100 mg total) by mouth 4 (four) times daily. 03/03/13   Roxy Horsemanobert Browning, PA-C  zolpidem (AMBIEN) 10 MG tablet Take 1 tablet (10 mg total) by mouth at bedtime. 03/03/13   Roxy Horsemanobert Browning, PA-C   BP 100/76 mmHg  Pulse 104  Temp(Src) 98.1 F (36.7 C) (Oral)  Resp 18  SpO2 99% Physical Exam  Constitutional: He is oriented to person, place, and time. He appears well-developed.  No distress, appears chronically ill.  HENT:  Head: Normocephalic and atraumatic.  Neck: Normal range of motion. Neck supple.  Cardiovascular: Normal rate.   Pulmonary/Chest: Effort normal.  Musculoskeletal: Normal range of motion.  Neurological: He is alert and oriented to person, place, and time.  Skin: Skin is warm and dry.  Psychiatric: His affect is angry. He is agitated and aggressive. He expresses no homicidal and no suicidal ideation.  Nursing note and vitals reviewed.   ED Course  Procedures (including critical care time) Labs Review Labs Reviewed - No data to display  Imaging Review No results found. I have personally reviewed and evaluated these images and lab results as part of my medical decision-making.    MDM  Final diagnoses:  Nausea  History of cirrhosis    This patient presents by EMS with the above complaints as documented in the history of present illness. I attempted to obtain a history from this patient, however he became confrontational and belittling towards me. Every question which was asked of him resulted in a snide comment, belittling remark, and eye roll. I attempted on multiple occasions to obtain a history and care for this patient, however he was never willing to allow me to do this. I excused myself from the room and returned later to try again once his family had arrived.  I again  attempted to obtain a history in the presence of his mother and other family member, however his belittling comments and abusive behavior towards me persisted and were also directed against his family.  His mother tells me that this is how he normally behaves and that this is nothing new.    After a third attempt to obtain a history, the patient became even more hateful toward me and refused to allow me to care for him.  At this point, the patient was making quite a scene and encroaching upon my ability to care for the other patients in the emergency department.  I informed him that he would be discharged and he was given his discharge paperwork.  He was then escorted from the ED.  In summary, this patient encounter was extremely difficult. He is very argumentative, manipulative, and belittling toward everyone that attempted to provide care to him.  This is well-documented in my note, the note of the PA working with me that day, and the nurses and EMS that transported him.  I do not feel his behavior is related to his medical condition, but this is his normal disposition toward others.  His mother informed me of this as well.  Although he refused to allow a workup to be initiated, I feel as though he received an appropriate medical screening exam and do not feel as though an emergent condition exists.      Geoffery Lyons, MD 08/13/15 1026

## 2015-08-13 NOTE — Discharge Instructions (Signed)
Follow-up with your primary doctor.  Your welcome to return if you decide that you want to go along with the care that was recommended.   Nausea, Adult Nausea is the feeling that you have an upset stomach or have to vomit. Nausea by itself is not likely a serious concern, but it may be an early sign of more serious medical problems. As nausea gets worse, it can lead to vomiting. If vomiting develops, there is the risk of dehydration.  CAUSES   Viral infections.  Food poisoning.  Medicines.  Pregnancy.  Motion sickness.  Migraine headaches.  Emotional distress.  Severe pain from any source.  Alcohol intoxication. HOME CARE INSTRUCTIONS  Get plenty of rest.  Ask your caregiver about specific rehydration instructions.  Eat small amounts of food and sip liquids more often.  Take all medicines as told by your caregiver. SEEK MEDICAL CARE IF:  You have not improved after 2 days, or you get worse.  You have a headache. SEEK IMMEDIATE MEDICAL CARE IF:   You have a fever.  You faint.  You keep vomiting or have blood in your vomit.  You are extremely weak or dehydrated.  You have dark or bloody stools.  You have severe chest or abdominal pain. MAKE SURE YOU:  Understand these instructions.  Will watch your condition.  Will get help right away if you are not doing well or get worse.   This information is not intended to replace advice given to you by your health care provider. Make sure you discuss any questions you have with your health care provider.   Document Released: 09/20/2004 Document Revised: 09/03/2014 Document Reviewed: 04/25/2011 Elsevier Interactive Patient Education [employer]2016 [employer] Inc[employer]

## 2015-08-13 NOTE — ED Notes (Addendum)
Pt wants 2 family members to come to his room. Pt extremely irritated and unable to reason with pt regarding his treatment.  Verbally abusive to RN, PA DR and this Clinical research associatewriter.

## 2015-08-13 NOTE — ED Notes (Signed)
His daughter phoned EMS per pt. Request that he feels "like I'm dying".  He told EMS he has "liver failure" and that he has been very nauseated recently.  He arrives in E.D. Awake, alert and in no distress.  CBG en route 224.  He is jaundiced.

## 2015-08-13 NOTE — ED Notes (Signed)
Upon beginning her exam, the pt. Became very rude and abusive in his demeanor; speaking very loudly (yelling at her).  She then paused for several minutes and re-entered his room to attempt to examine him, at which time he was even more abusive and demanded to see "a doctor".  Dr. Judd Lienelo subsequently came to examine him and received more abuse and profanity.

## 2015-08-13 NOTE — ED Provider Notes (Signed)
54 year old male with PMH of cirrhosis presents to the ED via EMS for nausea/vomiting. 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   Mady Gemmalizabeth C Westfall, PA-C 08/13/15 [id number]  Geoffery Lyonsouglas Delo, MD 08/13/15 [phone]

## 2015-08-13 NOTE — ED Notes (Signed)
Bed: WA10 Expected date: 08/13/15 Expected time: 8:23 AM Means of arrival: Ambulance Comments: Liver failure, uncooperative

## 2015-08-13 NOTE — ED Notes (Signed)
His mother and brother-in-law arrive and Dr. Judd Lienelo attempts again to examine the patient as he speaks with them; but the pt. Would have none of it.  He remains awake, alert and articulate as he remains loud, profane and utterly uncooperative.  Several staff here entreat pt. To please calm down and be treated, as Dr. Judd Lienelo offers to care for him if he would allow.  The patient absolutely refuses and demands to leave, which we facilitate as we (N.T. Marylu LundJanet) assists him with getting into his brother-in-law's vehicle.

## 2015-09-16 ENCOUNTER — Inpatient Hospital Stay (HOSPITAL_COMMUNITY)
Admission: EM | Admit: 2015-09-16 | Discharge: 2015-09-18 | DRG: 433 | Discharge status: Against Medical Advice | Payer: Medicare Other | Attending: Internal Medicine | Admitting: Internal Medicine

## 2015-09-16 ENCOUNTER — Encounter (HOSPITAL_COMMUNITY): Payer: Self-pay | Admitting: Emergency Medicine

## 2015-09-16 DIAGNOSIS — D6959 Other secondary thrombocytopenia: Secondary | ICD-10-CM | POA: Diagnosis present

## 2015-09-16 DIAGNOSIS — F1020 Alcohol dependence, uncomplicated: Secondary | ICD-10-CM | POA: Diagnosis present

## 2015-09-16 DIAGNOSIS — K7031 Alcoholic cirrhosis of liver with ascites: Secondary | ICD-10-CM | POA: Diagnosis not present

## 2015-09-16 DIAGNOSIS — Z8711 Personal history of peptic ulcer disease: Secondary | ICD-10-CM

## 2015-09-16 DIAGNOSIS — Z9114 Patient's other noncompliance with medication regimen: Secondary | ICD-10-CM

## 2015-09-16 DIAGNOSIS — G4700 Insomnia, unspecified: Secondary | ICD-10-CM | POA: Diagnosis present

## 2015-09-16 DIAGNOSIS — R188 Other ascites: Secondary | ICD-10-CM

## 2015-09-16 DIAGNOSIS — K7460 Unspecified cirrhosis of liver: Secondary | ICD-10-CM | POA: Diagnosis present

## 2015-09-16 DIAGNOSIS — D696 Thrombocytopenia, unspecified: Secondary | ICD-10-CM | POA: Diagnosis present

## 2015-09-16 DIAGNOSIS — I8500 Esophageal varices without bleeding: Secondary | ICD-10-CM | POA: Diagnosis present

## 2015-09-16 DIAGNOSIS — D649 Anemia, unspecified: Secondary | ICD-10-CM | POA: Diagnosis present

## 2015-09-16 DIAGNOSIS — E871 Hypo-osmolality and hyponatremia: Secondary | ICD-10-CM | POA: Diagnosis present

## 2015-09-16 DIAGNOSIS — K7290 Hepatic failure, unspecified without coma: Secondary | ICD-10-CM | POA: Diagnosis present

## 2015-09-16 DIAGNOSIS — Z886 Allergy status to analgesic agent status: Secondary | ICD-10-CM

## 2015-09-16 DIAGNOSIS — D6489 Other specified anemias: Secondary | ICD-10-CM

## 2015-09-16 DIAGNOSIS — R739 Hyperglycemia, unspecified: Secondary | ICD-10-CM | POA: Diagnosis present

## 2015-09-16 DIAGNOSIS — D500 Iron deficiency anemia secondary to blood loss (chronic): Secondary | ICD-10-CM

## 2015-09-16 DIAGNOSIS — F419 Anxiety disorder, unspecified: Secondary | ICD-10-CM | POA: Diagnosis present

## 2015-09-16 DIAGNOSIS — K4090 Unilateral inguinal hernia, without obstruction or gangrene, not specified as recurrent: Secondary | ICD-10-CM | POA: Diagnosis present

## 2015-09-16 LAB — CBC
HCT: 22.1 % — ABNORMAL LOW (ref 39.0–52.0)
Hemoglobin: 6.7 g/dL — CL (ref 13.0–17.0)
MCH: 25.1 pg — ABNORMAL LOW (ref 26.0–34.0)
MCHC: 30.3 g/dL (ref 30.0–36.0)
MCV: 82.8 fL (ref 78.0–100.0)
PLATELETS: 136 10*3/uL — AB (ref 150–400)
RBC: 2.67 MIL/uL — ABNORMAL LOW (ref 4.22–5.81)
RDW: 17.7 % — AB (ref 11.5–15.5)
WBC: 7.7 10*3/uL (ref 4.0–10.5)

## 2015-09-16 LAB — COMPREHENSIVE METABOLIC PANEL
ALK PHOS: 118 U/L (ref 38–126)
ALT: 27 U/L (ref 17–63)
AST: 38 U/L (ref 15–41)
Albumin: 2.6 g/dL — ABNORMAL LOW (ref 3.5–5.0)
Anion gap: 10 (ref 5–15)
BILIRUBIN TOTAL: 1.2 mg/dL (ref 0.3–1.2)
BUN: 10 mg/dL (ref 6–20)
CO2: 21 mmol/L — ABNORMAL LOW (ref 22–32)
CREATININE: 1.13 mg/dL (ref 0.61–1.24)
Calcium: 8.4 mg/dL — ABNORMAL LOW (ref 8.9–10.3)
Chloride: 100 mmol/L — ABNORMAL LOW (ref 101–111)
GFR calc Af Amer: >60 mL/min (ref >60)
Glucose, Bld: 173 mg/dL — ABNORMAL HIGH (ref 65–99)
POTASSIUM: 4 mmol/L (ref 3.5–5.1)
Sodium: 131 mmol/L — ABNORMAL LOW (ref 135–145)
TOTAL PROTEIN: 5.8 g/dL — AB (ref 6.5–8.1)

## 2015-09-16 LAB — LIPASE, BLOOD: Lipase: 57 U/L — ABNORMAL HIGH (ref 11–51)

## 2015-09-16 NOTE — ED Provider Notes (Signed)
CSN: [id number]     Arrival date & time 09/16/15  2204 History  By signing my name below, I, Octavia Heir, attest that this documentation has been prepared under the direction and in the presence of Lorre Nick, MD. Electronically Signed: Octavia Heir, ED Scribe. 09/17/2015. 12:11 AM.     Chief Complaint  Patient presents with  . Abdominal Pain      The history is provided by the patient and the EMS personnel. No language interpreter was used.   HPI Comments: Clayton Watson is a 55 y.o. male who has a hx of cirrhosis of liver, ascitic fluid, alcoholism and stomach ulcers presents to the Emergency Department complaining of constant, gradual worsening abdominal pain and swelling with associated shortness of breath and weakness onset 3 days ago. Pt reports he "slacked" off on his medication recently and the ascitic fluid has increased in his abdomen. He states he has these symptoms frequently. Pt also notes having a chronic inguinal hernia on his left side that has given him increased pain. He denies blood in stool, black stool, fever, and vomiting. Sx persistent and nothing makes them better, no tx used pta  Past Medical History  Diagnosis Date  . Cirrhosis of liver (HCC)   . Ascitic fluid   . Alcoholism (HCC)   . Anxiety   . History of stomach ulcers   . Insomnia    Past Surgical History  Procedure Laterality Date  . Appendectomy    . Hernia repair      inguinal hernia x 2   Family History  Problem Relation Age of Onset  . Colon cancer Neg Hx   . Liver disease Brother     Hep C-Blood Transfusion    Social History  Substance Use Topics  . Smoking status: Former Smoker -- 0.00 packs/day    Quit date: 12/26/2011  . Smokeless tobacco: Never Used  . Alcohol Use: No    Review of Systems  Respiratory: Positive for shortness of breath.   Gastrointestinal: Positive for abdominal pain. Negative for vomiting and blood in stool.  Neurological: Positive for weakness.  All other  systems reviewed and are negative.     Allergies  Nsaids  Home Medications   Prior to Admission medications   Medication Sig Start Date End Date Taking? Authorizing Provider  furosemide (LASIX) 40 MG tablet Take 120-160 mg by mouth 2 (two) times daily.   Yes Historical Provider, MD  Multiple Vitamin (MULITIVITAMIN WITH MINERALS) TABS Take 1 tablet by mouth daily.   Yes Historical Provider, MD  potassium chloride SA (K-DUR,KLOR-CON) 20 MEQ tablet Take 600 mEq by mouth daily.   Yes Historical Provider, MD  spironolactone (ALDACTONE) 100 MG tablet Take 1 tablet (100 mg total) by mouth 4 (four) times daily. Patient taking differently: Take 400 mg by mouth 2 (two) times daily.  03/03/13  Yes Roxy Horseman, PA-C  zolpidem (AMBIEN) 10 MG tablet Take 1 tablet (10 mg total) by mouth at bedtime. 03/03/13  Yes Roxy Horseman, PA-C   Triage vitals: BP 119/63 mmHg  Pulse 108  Temp(Src) 98.2 F (36.8 C) (Oral)  Resp 20  SpO2 100% Physical Exam  Constitutional: He is oriented to person, place, and time. He appears well-developed and well-nourished.  Non-toxic appearance. No distress.  HENT:  Head: Normocephalic and atraumatic.  Eyes: Conjunctivae, EOM and lids are normal. Pupils are equal, round, and reactive to light.  Neck: Normal range of motion. Neck supple. No tracheal deviation present. No thyroid mass present.  Cardiovascular: Normal rate, regular rhythm and normal heart sounds.  Exam reveals no gallop.   No murmur heard. Pulmonary/Chest: Effort normal and breath sounds normal. No stridor. No respiratory distress. He has no decreased breath sounds. He has no wheezes. He has no rhonchi. He has no rales.  Abdominal: Soft. Normal appearance and bowel sounds are normal. He exhibits no distension. There is no tenderness. There is no rebound and no CVA tenderness.  Very large ascitic and no pariteneal signs  Genitourinary:  Large hernia noted at left scrotum which is compressible and does not  appear to be incarcerated  Musculoskeletal: Normal range of motion. He exhibits no edema or tenderness.  Neurological: He is alert and oriented to person, place, and time. He has normal strength. No cranial nerve deficit or sensory deficit. GCS eye subscore is 4. GCS verbal subscore is 5. GCS motor subscore is 6.  Skin: Skin is warm and dry. No abrasion and no rash noted. There is pallor.  Psychiatric: He has a normal mood and affect. His speech is normal and behavior is normal.  Nursing note and vitals reviewed.   ED Course  Procedures  DIAGNOSTIC STUDIES: Oxygen Saturation is 100% on RA, normal by my interpretation.  COORDINATION OF CARE:  12:08 AM Discussed treatment plan which includes admit to the hospital with pt at bedside and pt agreed to plan.  Labs Review Labs Reviewed  LIPASE, BLOOD - Abnormal; Notable for the following:    Lipase 57 (*)    All other components within normal limits  COMPREHENSIVE METABOLIC PANEL - Abnormal; Notable for the following:    Sodium 131 (*)    Chloride 100 (*)    CO2 21 (*)    Glucose, Bld 173 (*)    Calcium 8.4 (*)    Total Protein 5.8 (*)    Albumin 2.6 (*)    All other components within normal limits  CBC - Abnormal; Notable for the following:    RBC 2.67 (*)    Hemoglobin 6.7 (*)    HCT 22.1 (*)    MCH 25.1 (*)    RDW 17.7 (*)    Platelets 136 (*)    All other components within normal limits  URINALYSIS, ROUTINE W REFLEX MICROSCOPIC (NOT AT [facility])    Imaging Review No results found. I have personally reviewed and evaluated these images and lab results as part of my medical decision-making.   EKG Interpretation None      MDM   Final diagnoses:  None    I personally performed the services described in this documentation, which was scribed in my presence. The recorded information has been reviewed and is accurate.   Pt to be admited for blood transfusion  Lorre Nick, MD 09/19/15 1523

## 2015-09-16 NOTE — ED Notes (Addendum)
Report from [facility]>  C/o abd pain and swelling x 4 weeks. Also reports L inguinal hernia pain. History of cirrhosis and ascites.

## 2015-09-17 ENCOUNTER — Observation Stay (HOSPITAL_COMMUNITY): Payer: Medicare Other

## 2015-09-17 ENCOUNTER — Encounter (HOSPITAL_COMMUNITY): Payer: Self-pay | Admitting: *Deleted

## 2015-09-17 DIAGNOSIS — F1020 Alcohol dependence, uncomplicated: Secondary | ICD-10-CM | POA: Diagnosis present

## 2015-09-17 DIAGNOSIS — D6489 Other specified anemias: Secondary | ICD-10-CM

## 2015-09-17 DIAGNOSIS — K4090 Unilateral inguinal hernia, without obstruction or gangrene, not specified as recurrent: Secondary | ICD-10-CM | POA: Diagnosis present

## 2015-09-17 DIAGNOSIS — D500 Iron deficiency anemia secondary to blood loss (chronic): Secondary | ICD-10-CM | POA: Diagnosis not present

## 2015-09-17 DIAGNOSIS — Z8711 Personal history of peptic ulcer disease: Secondary | ICD-10-CM | POA: Diagnosis not present

## 2015-09-17 DIAGNOSIS — R739 Hyperglycemia, unspecified: Secondary | ICD-10-CM | POA: Diagnosis present

## 2015-09-17 DIAGNOSIS — K7460 Unspecified cirrhosis of liver: Secondary | ICD-10-CM | POA: Diagnosis present

## 2015-09-17 DIAGNOSIS — R188 Other ascites: Secondary | ICD-10-CM | POA: Diagnosis present

## 2015-09-17 DIAGNOSIS — F419 Anxiety disorder, unspecified: Secondary | ICD-10-CM | POA: Diagnosis present

## 2015-09-17 DIAGNOSIS — D696 Thrombocytopenia, unspecified: Secondary | ICD-10-CM

## 2015-09-17 DIAGNOSIS — G4700 Insomnia, unspecified: Secondary | ICD-10-CM | POA: Diagnosis present

## 2015-09-17 DIAGNOSIS — Z9114 Patient's other noncompliance with medication regimen: Secondary | ICD-10-CM | POA: Diagnosis not present

## 2015-09-17 DIAGNOSIS — K7290 Hepatic failure, unspecified without coma: Secondary | ICD-10-CM | POA: Diagnosis present

## 2015-09-17 DIAGNOSIS — I8500 Esophageal varices without bleeding: Secondary | ICD-10-CM | POA: Diagnosis present

## 2015-09-17 DIAGNOSIS — Z886 Allergy status to analgesic agent status: Secondary | ICD-10-CM | POA: Diagnosis not present

## 2015-09-17 DIAGNOSIS — E871 Hypo-osmolality and hyponatremia: Secondary | ICD-10-CM | POA: Diagnosis present

## 2015-09-17 DIAGNOSIS — K7031 Alcoholic cirrhosis of liver with ascites: Secondary | ICD-10-CM | POA: Diagnosis present

## 2015-09-17 DIAGNOSIS — D6959 Other secondary thrombocytopenia: Secondary | ICD-10-CM | POA: Diagnosis present

## 2015-09-17 LAB — BASIC METABOLIC PANEL
Anion gap: 10 (ref 5–15)
BUN: 11 mg/dL (ref 6–20)
CALCIUM: 8 mg/dL — AB (ref 8.9–10.3)
CO2: 21 mmol/L — AB (ref 22–32)
CREATININE: 1 mg/dL (ref 0.61–1.24)
Chloride: 100 mmol/L — ABNORMAL LOW (ref 101–111)
GFR calc Af Amer: >60 mL/min (ref >60)
GFR calc non Af Amer: >60 mL/min (ref >60)
GLUCOSE: 121 mg/dL — AB (ref 65–99)
Potassium: 3.7 mmol/L (ref 3.5–5.1)
Sodium: 131 mmol/L — ABNORMAL LOW (ref 135–145)

## 2015-09-17 LAB — CBC
HEMATOCRIT: 21.5 % — AB (ref 39.0–52.0)
Hemoglobin: 6.7 g/dL — CL (ref 13.0–17.0)
MCH: 25.8 pg — AB (ref 26.0–34.0)
MCHC: 31.2 g/dL (ref 30.0–36.0)
MCV: 82.7 fL (ref 78.0–100.0)
Platelets: 130 10*3/uL — ABNORMAL LOW (ref 150–400)
RBC: 2.6 MIL/uL — ABNORMAL LOW (ref 4.22–5.81)
RDW: 17.6 % — AB (ref 11.5–15.5)
WBC: 7.8 10*3/uL (ref 4.0–10.5)

## 2015-09-17 LAB — HEMOGLOBIN AND HEMATOCRIT, BLOOD
HCT: 20.7 % — ABNORMAL LOW (ref 39.0–52.0)
Hemoglobin: 6.2 g/dL — CL (ref 13.0–17.0)

## 2015-09-17 LAB — URINALYSIS, ROUTINE W REFLEX MICROSCOPIC
Bilirubin Urine: NEGATIVE
Glucose, UA: NEGATIVE mg/dL
Hgb urine dipstick: NEGATIVE
KETONES UR: NEGATIVE mg/dL
LEUKOCYTES UA: NEGATIVE
Nitrite: NEGATIVE
PH: 5.5 (ref 5.0–8.0)
PROTEIN: NEGATIVE mg/dL
Specific Gravity, Urine: 1.016 (ref 1.005–1.030)

## 2015-09-17 LAB — ETHANOL: Alcohol, Ethyl (B): <5 mg/dL (ref <5)

## 2015-09-17 LAB — APTT: aPTT: 32 seconds (ref 24–37)

## 2015-09-17 LAB — PREPARE RBC (CROSSMATCH)

## 2015-09-17 LAB — PROTIME-INR
INR: 1.65 — ABNORMAL HIGH (ref 0.00–1.49)
Prothrombin Time: 19.5 seconds — ABNORMAL HIGH (ref 11.6–15.2)

## 2015-09-17 MED ORDER — ONDANSETRON HCL 4 MG/2ML IJ SOLN: 4.0000 mg | Freq: Four times a day (QID) | INTRAMUSCULAR | Status: DC | PRN

## 2015-09-17 MED ORDER — ALBUMIN HUMAN 25 % IV SOLN
50.0000 g | Freq: Once | INTRAVENOUS | Status: AC
  Administered 2015-09-17: 50 g via INTRAVENOUS
  Filled 2015-09-17: qty 200

## 2015-09-17 MED ORDER — SODIUM CHLORIDE 0.9 % IV SOLN: Freq: Once | INTRAVENOUS | Status: DC

## 2015-09-17 MED ORDER — FUROSEMIDE 40 MG PO TABS: 120.0000 mg | ORAL_TABLET | Freq: Two times a day (BID) | ORAL | Status: DC

## 2015-09-17 MED ORDER — SPIRONOLACTONE 50 MG PO TABS
400.0000 mg | ORAL_TABLET | Freq: Two times a day (BID) | ORAL | Status: DC
  Administered 2015-09-17: 400 mg via ORAL
  Filled 2015-09-17: qty 16
  Filled 2015-09-17 (×3): qty 4

## 2015-09-17 MED ORDER — FUROSEMIDE 10 MG/ML IJ SOLN
60.0000 mg | Freq: Once | INTRAMUSCULAR | Status: AC
  Administered 2015-09-17: 60 mg via INTRAVENOUS
  Filled 2015-09-17: qty 6

## 2015-09-17 MED ORDER — ZOLPIDEM TARTRATE 5 MG PO TABS: 5.0000 mg | ORAL_TABLET | Freq: Every evening | ORAL | Status: DC | PRN

## 2015-09-17 MED ORDER — FUROSEMIDE 80 MG PO TABS
120.0000 mg | ORAL_TABLET | ORAL | Status: DC
  Administered 2015-09-17: 120 mg via ORAL

## 2015-09-17 MED ORDER — SODIUM CHLORIDE 0.9 % IJ SOLN
3.0000 mL | Freq: Two times a day (BID) | INTRAMUSCULAR | Status: DC
  Administered 2015-09-17 – 2015-09-18 (×3): 3 mL via INTRAVENOUS

## 2015-09-17 MED ORDER — FUROSEMIDE 80 MG PO TABS
160.0000 mg | ORAL_TABLET | ORAL | Status: DC
  Administered 2015-09-18: 160 mg via ORAL
  Filled 2015-09-17 (×2): qty 2

## 2015-09-17 MED ORDER — HYDROMORPHONE HCL 1 MG/ML IJ SOLN
1.0000 mg | INTRAMUSCULAR | Status: DC | PRN
  Administered 2015-09-17 (×2): 1 mg via INTRAVENOUS
  Filled 2015-09-17 (×2): qty 1

## 2015-09-17 MED ORDER — ONDANSETRON HCL 4 MG PO TABS: 4.0000 mg | ORAL_TABLET | Freq: Four times a day (QID) | ORAL | Status: DC | PRN

## 2015-09-17 MED ORDER — ZOLPIDEM TARTRATE 5 MG PO TABS
10.0000 mg | ORAL_TABLET | Freq: Every day | ORAL | Status: DC
  Administered 2015-09-17 (×2): 10 mg via ORAL
  Filled 2015-09-17 (×2): qty 2

## 2015-09-17 MED ORDER — ADULT MULTIVITAMIN W/MINERALS CH
1.0000 | ORAL_TABLET | Freq: Every day | ORAL | Status: DC
  Administered 2015-09-17 – 2015-09-18 (×2): 1 via ORAL
  Filled 2015-09-17 (×2): qty 1

## 2015-09-17 MED ORDER — MORPHINE SULFATE (PF) 2 MG/ML IV SOLN
1.0000 mg | INTRAVENOUS | Status: DC | PRN
  Administered 2015-09-17: 1 mg via INTRAVENOUS
  Filled 2015-09-17: qty 1

## 2015-09-17 MED ORDER — SPIRONOLACTONE 50 MG PO TABS
200.0000 mg | ORAL_TABLET | Freq: Every day | ORAL | Status: DC
  Administered 2015-09-18: 200 mg via ORAL
  Filled 2015-09-17: qty 4

## 2015-09-17 MED ORDER — OXYCODONE HCL 5 MG PO TABS
5.0000 mg | ORAL_TABLET | ORAL | Status: DC | PRN
  Administered 2015-09-17 (×2): 5 mg via ORAL
  Filled 2015-09-17 (×2): qty 1

## 2015-09-17 MED ORDER — LIDOCAINE HCL (PF) 1 % IJ SOLN
INTRAMUSCULAR | Status: AC
  Filled 2015-09-17: qty 10

## 2015-09-17 MED ORDER — INSULIN ASPART 100 UNIT/ML ~~LOC~~ SOLN: 0.0000 [IU] | SUBCUTANEOUS | Status: DC

## 2015-09-17 MED ORDER — ALBUTEROL SULFATE (2.5 MG/3ML) 0.083% IN NEBU: 2.5000 mg | INHALATION_SOLUTION | RESPIRATORY_TRACT | Status: DC | PRN

## 2015-09-17 NOTE — ED Notes (Signed)
Pt continues to remove cardiac monitoring; instructed pt to leave monitoring leads on

## 2015-09-17 NOTE — ED Notes (Signed)
Dr Smith @ bedside

## 2015-09-17 NOTE — ED Notes (Signed)
Admitting MD at bedside.

## 2015-09-17 NOTE — ED Notes (Signed)
Pt c/o 10/10 abdominal pain, Morphine 1 mg IV given as prescribe for pain, pt states 1 mg doesn't help a bit of his pain and he will like [place] to notified MD to get a higher dose of Morphine, pt oriented that MD will be notified.

## 2015-09-17 NOTE — H&P (Addendum)
[facility] History and Physical  Kacyn Souder WUJ:[id number] DOB: [birth year] DOA: 09/16/2015  Referring physician: ED PCP: Dorrene German, MD   Chief Complaint: Abdominal swelling  HPI:  Clayton Watson is a 55 year old male with a past medical history of alcoholism, decompensated alcoholic cirrhosis of the liver, and noncompliance with medications; who presents with complaints of gradually worsening abdominal distention as he reports slacking off on taking his medications.  Associated symptoms include shortness of breath and back pain. Denies any hemoptysis, nausea, vomiting, blood in stool, fever, or chest pain. Notes that because his medications are at different pharmacies it's hard for him to keep up with each prescription that he is currently on. He notes that he is retaining fluid, but is unsure of what his weight is.  Normally he is around 160-170 pounds. He also complains of inguinal hernia in pain. Upon admission to the emergency department he was evaluated and seen to have hemoglobin of 6.7g/dl in emergency room. The ED physician typed and screen the patient and recommended 2 units to be transfused. Guaiac stools were pending   Review of Systems  Constitutional: Negative for chills and weight loss.        Weight gain  HENT: Negative for ear pain.   Eyes: Negative for photophobia and pain.  Respiratory: Positive for shortness of breath. Negative for hemoptysis.   Cardiovascular: Negative for chest pain and palpitations.  Gastrointestinal: Positive for abdominal pain. Negative for nausea, vomiting and blood in stool.  Genitourinary: Negative for hematuria.  Musculoskeletal: Positive for back pain. Negative for falls.  Skin: Positive for rash. Negative for itching.  Neurological: Negative for seizures, loss of consciousness and headaches.  Endo/Heme/Allergies: Negative for environmental allergies. Bruises/bleeds easily.  Psychiatric/Behavioral: Negative for hallucinations and  substance abuse.       Past Medical History  Diagnosis Date  . Cirrhosis of liver (HCC)   . Ascitic fluid   . Alcoholism (HCC)   . Anxiety   . History of stomach ulcers   . Insomnia      Past Surgical History  Procedure Laterality Date  . Appendectomy    . Hernia repair      inguinal hernia x 2      Social History:  reports that he quit smoking about 3 years ago. He has never used smokeless tobacco. He reports that he does not drink alcohol or use illicit drugs. Where does patient live--home  Can patient participate in ADLs? Yes  Allergies  Allergen Reactions  . Nsaids     Bleeding due to ulcer     Family History  Problem Relation Age of Onset  . Colon cancer Neg Hx   . Liver disease Brother     Hep C-Blood Transfusion         Prior to Admission medications   Medication Sig Start Date End Date Taking? Authorizing Provider  furosemide (LASIX) 40 MG tablet Take 120-160 mg by mouth 2 (two) times daily.   Yes Historical Provider, MD  Multiple Vitamin (MULITIVITAMIN WITH MINERALS) TABS Take 1 tablet by mouth daily.   Yes Historical Provider, MD  potassium chloride SA (K-DUR,KLOR-CON) 20 MEQ tablet Take 600 mEq by mouth daily.   Yes Historical Provider, MD  spironolactone (ALDACTONE) 100 MG tablet Take 1 tablet (100 mg total) by mouth 4 (four) times daily. Patient taking differently: Take 400 mg by mouth 2 (two) times daily.  03/03/13  Yes Roxy Horseman, PA-C  zolpidem (AMBIEN) 10 MG tablet Take 1 tablet (10 mg  total) by mouth at bedtime. 03/03/13  Yes Roxy Horseman, PA-C     Physical Exam: Filed Vitals:   09/16/15 2211 09/16/15 2356  BP: 119/63 119/73  Pulse: 108 105  Temp: 98.2 F (36.8 C)   TempSrc: Oral   Resp: 20 14  SpO2: 100% 100%     Constitutional: Vital signs reviewed. Patient appears uncomfortable laying in hospital bed, but in no acute distress. Head: Normocephalic and atraumatic  Ear: TM normal bilaterally  Mouth: no erythema or exudates,  MMM  Eyes: PERRL, EOMI, conjunctivae normal, No scleral icterus.  Neck: Supple, Trachea midline normal ROM, No JVD, mass, thyromegaly, or carotid bruit present.  Cardiovascular: RRR, S1 normal, S2 normal, no MRG, pulses symmetric and intact bilaterally  Pulmonary/Chest:  Decreased aeration secondary to abdominal distention. No wheezing Abdominal: Distended with signs of caput medusa, positive fluid wave, mild tenderness to palpation generalized across abdomen. No peritoneal signs GU:  Left inguinal hernia Musculoskeletal: No joint deformities, erythema, or stiffness, ROM full and no nontender Ext: no edema and no cyanosis, pulses palpable bilaterally (DP and PT)  Hematology: no cervical, inginal, or axillary adenopathy.  Neurological: A&O x3, Strenght is normal and symmetric bilaterally, cranial nerve II-XII are grossly intact, no focal motor deficit, sensory intact to light touch bilaterally.  Skin: Warm, dry and intact. Spider hemangiomas present  Psychiatric: Normal mood and affect. speech and behavior is normal. Judgment and thought content normal. Cognition and memory are normal.      Data Review   Micro Results No results found for this or any previous visit (from the past 240 hour(s)).  Radiology Reports No results found.   CBC  Recent Labs Lab 09/16/15 2235  WBC 7.7  HGB 6.7*  HCT 22.1*  PLT 136*  MCV 82.8  MCH 25.1*  MCHC 30.3  RDW 17.7*    Chemistries   Recent Labs Lab 09/16/15 2235  NA 131*  K 4.0  CL 100*  CO2 21*  GLUCOSE 173*  BUN 10  CREATININE 1.13  CALCIUM 8.4*  AST 38  ALT 27  ALKPHOS 118  BILITOT 1.2   ------------------------------------------------------------------------------------------------------------------ CrCl cannot be calculated (Unknown ideal weight.). ------------------------------------------------------------------------------------------------------------------ No results for input(s): HGBA1C in the last 72  hours. ------------------------------------------------------------------------------------------------------------------ No results for input(s): CHOL, HDL, LDLCALC, TRIG, CHOLHDL, LDLDIRECT in the last 72 hours. ------------------------------------------------------------------------------------------------------------------ No results for input(s): TSH, T4TOTAL, T3FREE, THYROIDAB in the last 72 hours.  Invalid input(s): FREET3 ------------------------------------------------------------------------------------------------------------------ No results for input(s): VITAMINB12, FOLATE, FERRITIN, TIBC, IRON, RETICCTPCT in the last 72 hours.  Coagulation profile No results for input(s): INR, PROTIME in the last 168 hours.  No results for input(s): DDIMER in the last 72 hours.  Cardiac Enzymes No results for input(s): CKMB, TROPONINI, MYOGLOBIN in the last 168 hours.  Invalid input(s): CK ------------------------------------------------------------------------------------------------------------------ Invalid input(s): POCBNP   CBG: No results for input(s): GLUCAP in the last 168 hours.        Assessment/Plan Active Problems: Decompensated hepatic cirrhosis with ascites: Acute on chronic. Patient reports not taking medications as previously advised and presents with significant abdominal distention and abdominal pain. Patient does not appear to have any peritoneal signs. -  Admit to telemetry bed -  Check PT/INR -  Ultrasound guided paracentesis in a.m. -  Care management consult for noncompliance of medication  -  Lasix 60 mg IV 1 dose now  -  Continue home Lasix and  Spironolactone, but verify these are correct doses -  Consult social work -  Consider need of  consultation to GI  Anemia: Acute. Patient hemoglobin noted to be 6.7 on admission. In the emergency department question the possibility of active bleed /acute loss. Transfused 2 units of blood. -  Continue to  monitor CBC -  Stool guaiac pending -  May warrant GI consultation   Thrombocytopenia: Mild. Platelet count 134 likely secondary to patient's history of cirrhosis - Continue to monitor  Hyperglycemia: Blood glucose elevated at 173 on admission last hemoglobin A1c was 5.3 on 09/03/2014. -  Check hemoglobin A1c -  CBGs every 4 hours with sliding scale insulin  Alcohol abuse history: Patient states that he is refraining from drinking after he was diagnosed with cirrhosis.    DVT prophylaxis SCDs  Code Status:   full Family Communication: bedside Disposition Plan: admit   Total time spent 55 minutes.Greater than 50% of this time was spent in counseling, explanation of diagnosis, planning of further management, and coordination of care  Clydie Braun [facility] Pager [phone]  If 7PM-7AM, please contact night-coverage [email or web address] Password [email or web address] 09/17/2015, 1:36 AM

## 2015-09-17 NOTE — ED Notes (Signed)
Attempted report 

## 2015-09-17 NOTE — ED Notes (Signed)
Blood bank called stating antigens are present with cross match; delay in infusion

## 2015-09-17 NOTE — ED Notes (Signed)
IV attempted x2 without success.

## 2015-09-17 NOTE — ED Notes (Signed)
Pt requesting Demerol for pain; informed pt this medication is no longer available. Requesting "  morphing or even  of morphine. I have a high tolerance for pain."

## 2015-09-17 NOTE — ED Notes (Signed)
Pt states he cannot urinate at all. Pt given urinal and instructed to continue to try. RN notified.

## 2015-09-17 NOTE — ED Notes (Signed)
Pt called out requesting his daily dose of ambien for sleep assistance. RN notified.

## 2015-09-17 NOTE — Progress Notes (Signed)
PROGRESS NOTE  Clayton Watson ZOX:[id number] DOB: 12/15/1960 DOA: 09/16/2015 PCP: Dorrene German, MD Brief History 55 year old male with a history of alcoholic cirrhosis, portal compliance, presented with a few week history of increasing abdominal distention and abdominal pain. The patient had increasing shortness of breath and back pain. He denied any fevers, chills, chest discomfort, hemoptysis, melena, hematochezia, hematemesis. The patient states that he has not been fully compliant with his diuretics. He is quite tangential when asked direct questions. Nevertheless, it appears that he has not been taking his medications for some time. Later in the conversation, the patient states that he has been out of his medications and needs new prescriptions. Assessment/Plan: Decompensated Liver Cirrhosis with Ascites -Status post paracentesis--8.7 L -Albumin 50 g IV x 1 -Restart furosemide 160 mg a.m., 120 mg p.m. -Restart spironolactone 200 mg twice a day -pt to follow up with Dr. Pollyann Samples at [facility] 12/22/15 Thrombocytopenia -Secondary to chronic liver disease Chronic blood loss anemia -Patient had hemoglobin 13.8 on 02/02/2015 -Patient presented with hemoglobin 6.7 -pt with esophageal varices last banded 12/13/14 -concerned about slow variceal bleed--denies hematemesis, melena, hematochezia -consult GI--spoke with Dr. Leone Payor -transfuse 2 units -check iron stores, B12, RBC folate Hyponatremia -secondary to liver cirrhosis Hyperglycemia -Hemoglobin A1c pending -09/03/2014 hemoglobin A1c 5.3 Coagulopathy -INR 1.65 -Secondary to chronic liver disease -vitamin K x 1 Tobacco use, in remission   Family Communication:   Pt at beside Disposition Plan:   Home 1-2 days       Procedures/Studies: US Paracentesis  09/17/2015  INDICATION: Cirrhosis, recurrent ascites. Request is made for therapeutic paracentesis. EXAM: ULTRASOUND-GUIDED THERAPEUTIC PARACENTESIS  COMPARISON:  Prior paracentesis on 09/04/2014 MEDICATIONS: None. COMPLICATIONS: None immediate TECHNIQUE: Informed written consent was obtained from the patient after a discussion of the risks, benefits and alternatives to treatment. A timeout was performed prior to the initiation of the procedure. Initial ultrasound scanning demonstrates a large amount of ascites within the right lower abdominal quadrant. The right lower abdomen was prepped and draped in the usual sterile fashion. 1% lidocaine was used for local anesthesia. Under direct ultrasound guidance, a 19 gauge, 7-cm, Yueh catheter was introduced. An ultrasound image was saved for documentation purposed. The paracentesis was performed. The catheter was removed and a dressing was applied. The patient tolerated the procedure well without immediate post procedural complication. FINDINGS: A total of approximately 8.7 liters of hazy, light yellow fluid was removed. IMPRESSION: Successful ultrasound-guided therapeutic paracentesis yielding 8.7 liters of peritoneal fluid. Read by: Jeananne Rama, PA-C Electronically Signed   By: Irish Lack M.D.   On: 09/17/2015 10:51         Subjective: Patient denies fevers, chills, headache, chest pain, dyspnea, nausea, vomiting, diarrhea, abdominal pain, dysuria, hematuria   Objective: Filed Vitals:   09/17/15 1001 09/17/15 1155 09/17/15 1357 09/17/15 1435  BP: 113/58 109/63 95/60 114/66  Pulse:  107 101 100  Temp:  98.4 F (36.9 C) 98.3 F (36.8 C) 98.4 F (36.9 C)  TempSrc:  Oral Oral Oral  Resp:  Height:      Weight:      SpO2:  98% 98% 98%    Intake/Output Summary (Last 24 hours) at 09/17/15 1606 Last data filed at 09/17/15 1100  Gross per 24 hour  Intake    360 ml  Output   1375 ml  Net  -1015 ml   Weight change:  Exam:   General:  Pt is alert, follows commands appropriately, not in acute distress  HEENT: No icterus, No thrush, No neck mass, [place]/AT  Cardiovascular: RRR,  S1/S2, no rubs, no gallops  Respiratory: CTA bilaterally, no wheezing, no crackles, no rhonchi  Abdomen: Soft/+BS, non tender, non distended, no guarding  Extremities: No edema, No lymphangitis, No petechiae, No rashes, no synovitis; clubbing without cyanosis  Data Reviewed: Basic Metabolic Panel:  Recent Labs Lab 09/16/15 2235 09/17/15 0515  NA 131* 131*  K 4.0 3.7  CL 100* 100*  CO2 21* 21*  GLUCOSE 173* 121*  BUN 10 11  CREATININE 1.13 1.00  CALCIUM 8.4* 8.0*   Liver Function Tests:  Recent Labs Lab 09/16/15 2235  AST 38  ALT 27  ALKPHOS 118  BILITOT 1.2  PROT 5.8*  ALBUMIN 2.6*    Recent Labs Lab 09/16/15 2235  LIPASE 57*   No results for input(s): AMMONIA in the last 168 hours. CBC:  Recent Labs Lab 09/16/15 2235 09/17/15 0515 09/17/15 1247  WBC 7.7 7.8  --   HGB 6.7* 6.7* 6.2*  HCT 22.1* 21.5* 20.7*  MCV 82.8 82.7  --   PLT 136* 130*  --    Cardiac Enzymes: No results for input(s): CKTOTAL, CKMB, CKMBINDEX, TROPONINI in the last 168 hours. BNP: Invalid input(s): POCBNP CBG: No results for input(s): GLUCAP in the last 168 hours.  No results found for this or any previous visit (from the past 240 hour(s)).   Scheduled Meds: . sodium chloride   Intravenous Once  . albumin human  50 g Intravenous Once  . furosemide  120 mg Oral Q24H  . furosemide  160 mg Oral Q24H  . lidocaine (PF)      . multivitamin with minerals  1 tablet Oral Daily  . sodium chloride  3 mL Intravenous Q12H  . [START ON 09/18/2015] spironolactone  200 mg Oral Daily  . zolpidem  10 mg Oral QHS   Continuous Infusions:    Tiffiny Worthy, DO  [facility] Pager [phone]  If 7PM-7AM, please contact night-coverage [email or web address] Password [email or web address] 09/17/2015, 4:06 PM   LOS: 0 days

## 2015-09-17 NOTE — Procedures (Signed)
Ultrasound-guided  therapeutic paracentesis performed yielding 8.7 liters of hazy, light yellow fluid. No immediate complications.

## 2015-09-17 NOTE — ED Notes (Signed)
Attempted report x 2 

## 2015-09-17 NOTE — ED Notes (Signed)
Pt refused hemoccult card

## 2015-09-18 NOTE — Discharge Summary (Signed)
Physician Discharge Summary  Clayton Watson ZOX:[id number] DOB: 10/15/1960 DOA: 09/16/2015  PCP: Dorrene German, MD  Admit date: 09/16/2015 Discharge date: 09/18/2015  PATIENT LEFT AGAINST MEDICAL ADVICE HE REFUSED TO SIGN AMA FORM HE REFUSED TO WAIT FOR ME TO COME SPEAK WITH HIM HE REFUSED TO WAIT FOR PRESCRIPTIONS TO BE SENT TO HIS PHARMACY   Discharge Diagnoses:  Decompensated Liver Cirrhosis with Ascites -Status post paracentesis--8.7 L -Albumin 50 g IV x 1 -Restart furosemide 160 mg a.m., 120 mg p.m. -Restart spironolactone 200 mg twice a day -pt to follow up with Dr. Pollyann Samples at [facility] 12/22/15 Thrombocytopenia -Secondary to chronic liver disease Chronic blood loss anemia -Patient had hemoglobin 13.8 on 02/02/2015 -Patient presented with hemoglobin 6.7 -pt with esophageal varices last banded 12/13/14 -concerned about slow variceal bleed--denies hematemesis, melena, hematochezia -consult GI--spoke with Dr. Leone Payor -patient left AMA prior to being seen by GI -transfused 2 units -check iron stores, B12, RBC folate--pt left AMA before am lab Hyponatremia -secondary to liver cirrhosis Hyperglycemia -Hemoglobin A1c pending -09/03/2014 hemoglobin A1c 5.3 Coagulopathy -INR 1.65 -Secondary to chronic liver disease -vitamin K x 1 Tobacco use, in remission  Discharge Condition: AMA  Disposition: AMA  Diet: low sodium Wt Readings from Last 3 Encounters:  09/17/15 82.328 kg (181 lb 8 oz)  09/04/14 68.6 kg (151 lb 3.8 oz)  08/03/14 84.5 kg (186 lb 4.6 oz)    History of present illness:  55 year old male with a history of alcoholic cirrhosis, portal compliance, presented with a few week history of increasing abdominal distention and abdominal pain. The patient had increasing shortness of breath and back pain. He denied any fevers, chills, chest discomfort, hemoptysis, melena, hematochezia, hematemesis. The patient states that he has not been fully compliant  with his diuretics. He is quite tangential when asked direct questions. Nevertheless, it appears that he has not been taking his medications for some time. Later in the conversation, the patient states that he has been out of his medications and needs new prescriptions. Nevertheless, the patient underwent paracentesis removing 8.7 L of fluid. He was symptomatically improved. The patient was started back on furosemide and spironolactone. Unfortunately, on the morning of 09/18/2015 the patient left AGAINST MEDICAL ADVICE. He refused to sign paperwork. He also refused to wait for me to come to the floor to speak with him.  Discharge Exam: Filed Vitals:   09/17/15 1800 09/17/15 2100  BP: 115/61 121/70  Pulse: 91 100  Temp: 98.8 F (37.1 C)   Resp: 18 16   Filed Vitals:   09/17/15 1357 09/17/15 1435 09/17/15 1800 09/17/15 2100  BP: 95/60 114/66 115/61 121/70  Pulse: 101 100 91 100  Temp: 98.3 F (36.8 C) 98.4 F (36.9 C) 98.8 F (37.1 C)   TempSrc: Oral Oral Oral   Resp: Height:      Weight:      SpO2: 98% 98% 97% 100%   General: A&O x 3, NAD, pleasant, cooperative Cardiovascular: RRR, no rub, no gallop, no S3 Respiratory: CTAB, no wheeze, no rhonchi Abdomen:soft, nontender, nondistended, positive bowel sounds Extremities: No edema, No lymphangitis, no petechiae  Discharge Instructions     Medication List    TAKE these medications        furosemide 40 MG tablet  Commonly known as:  LASIX  Take 120-160 mg by mouth 2 (two) times daily.     multivitamin with minerals Tabs tablet  Take 1 tablet by mouth daily.  potassium chloride SA 20 MEQ tablet  Commonly known as:  K-DUR,KLOR-CON  Take 600 mEq by mouth daily.     spironolactone 100 MG tablet  Commonly known as:  ALDACTONE  Take 1 tablet (100 mg total) by mouth 4 (four) times daily.     zolpidem 10 MG tablet  Commonly known as:  AMBIEN  Take 1 tablet (10 mg total) by mouth at bedtime.         The  results of significant diagnostics from this hospitalization (including imaging, microbiology, ancillary and laboratory) are listed below for reference.    Significant Diagnostic Studies: US Paracentesis  09/17/2015  INDICATION: Cirrhosis, recurrent ascites. Request is made for therapeutic paracentesis. EXAM: ULTRASOUND-GUIDED THERAPEUTIC PARACENTESIS COMPARISON:  Prior paracentesis on 09/04/2014 MEDICATIONS: None. COMPLICATIONS: None immediate TECHNIQUE: Informed written consent was obtained from the patient after a discussion of the risks, benefits and alternatives to treatment. A timeout was performed prior to the initiation of the procedure. Initial ultrasound scanning demonstrates a large amount of ascites within the right lower abdominal quadrant. The right lower abdomen was prepped and draped in the usual sterile fashion. 1% lidocaine was used for local anesthesia. Under direct ultrasound guidance, a 19 gauge, 7-cm, Yueh catheter was introduced. An ultrasound image was saved for documentation purposed. The paracentesis was performed. The catheter was removed and a dressing was applied. The patient tolerated the procedure well without immediate post procedural complication. FINDINGS: A total of approximately 8.7 liters of hazy, light yellow fluid was removed. IMPRESSION: Successful ultrasound-guided therapeutic paracentesis yielding 8.7 liters of peritoneal fluid. Read by: Jeananne Rama, PA-C Electronically Signed   By: Irish Lack M.D.   On: 09/17/2015 10:51     Microbiology: No results found for this or any previous visit (from the past 240 hour(s)).   Labs: Basic Metabolic Panel:  Recent Labs Lab 09/16/15 2235 09/17/15 0515  NA 131* 131*  K 4.0 3.7  CL 100* 100*  CO2 21* 21*  GLUCOSE 173* 121*  BUN 10 11  CREATININE 1.13 1.00  CALCIUM 8.4* 8.0*   Liver Function Tests:  Recent Labs Lab 09/16/15 2235  AST 38  ALT 27  ALKPHOS 118  BILITOT 1.2  PROT 5.8*  ALBUMIN 2.6*     Recent Labs Lab 09/16/15 2235  LIPASE 57*   No results for input(s): AMMONIA in the last 168 hours. CBC:  Recent Labs Lab 09/16/15 2235 09/17/15 0515 09/17/15 1247  WBC 7.7 7.8  --   HGB 6.7* 6.7* 6.2*  HCT 22.1* 21.5* 20.7*  MCV 82.8 82.7  --   PLT 136* 130*  --    Cardiac Enzymes: No results for input(s): CKTOTAL, CKMB, CKMBINDEX, TROPONINI in the last 168 hours. BNP: Invalid input(s): POCBNP CBG: No results for input(s): GLUCAP in the last 168 hours.  Time coordinating discharge:  Greater than 30 minutes  Signed:  Mak Bonny, DO [facility] Pager: [phone] 09/18/2015, 7:38 PM

## 2015-09-18 NOTE — Progress Notes (Signed)
1000 Entered pt room for medication pass. Pt dressed in street clothes and states "I am ready to leave. Can you take out this IV". After explaining the difference from being discharged and leaving AMA, pt states "Well I don't know why the doctor would not discharge me because he is the one that refuses to do my hernia repair." IV was removed. Dr. Arbutus Leas paged regarding pt potentially leaving AMA.  1040 Pt has decided to leave AMA. Risks to leaving AMA explained to pt in full detail including possibility for sudden death. Pt is AOx4 and verbalized understanding but still wishes to leave AMA. Pt refuses to sign AMA papers because "the papers state I am refusing treatment yet this hospital is the one that is refusing my treatment".  Alice Rieger, Dorann Lodge Gomez-Nurse Tech both witnesses to pt refusing to sign AMA papers.  Dr. Arbutus Leas paged. Pt walked off floor.  Jilda Panda RN

## 2015-09-18 NOTE — Final Progress Note (Signed)
Refusing all treatment including blood transfusion because he "does not trust the doctors" Clayton Watson. [place] PA notified.

## 2015-09-19 LAB — TYPE AND SCREEN
ABO/RH(D): O POS
Antibody Screen: POSITIVE
DAT, IgG: NEGATIVE
DONOR AG TYPE: NEGATIVE
DONOR AG TYPE: NEGATIVE
Unit division: 0
Unit division: 0

## 2015-09-19 LAB — HEMOGLOBIN A1C
Hgb A1c MFr Bld: 6.4 % — ABNORMAL HIGH (ref 4.8–5.6)
MEAN PLASMA GLUCOSE: 137 mg/dL

## 2015-09-20 ENCOUNTER — Encounter (HOSPITAL_COMMUNITY): Payer: Self-pay | Admitting: *Deleted

## 2015-09-20 ENCOUNTER — Inpatient Hospital Stay (HOSPITAL_COMMUNITY)
Admission: EM | Admit: 2015-09-20 | Discharge: 2015-09-24 | DRG: 432 | Disposition: A | Discharge status: Home / Self Care | Payer: Medicare Other | Attending: Internal Medicine | Admitting: Internal Medicine

## 2015-09-20 ENCOUNTER — Emergency Department (HOSPITAL_COMMUNITY): Payer: Medicare Other

## 2015-09-20 ENCOUNTER — Inpatient Hospital Stay (HOSPITAL_COMMUNITY): Payer: Medicare Other

## 2015-09-20 DIAGNOSIS — R188 Other ascites: Secondary | ICD-10-CM

## 2015-09-20 DIAGNOSIS — F419 Anxiety disorder, unspecified: Secondary | ICD-10-CM | POA: Diagnosis present

## 2015-09-20 DIAGNOSIS — Z7682 Awaiting organ transplant status: Secondary | ICD-10-CM | POA: Diagnosis not present

## 2015-09-20 DIAGNOSIS — D509 Iron deficiency anemia, unspecified: Secondary | ICD-10-CM | POA: Diagnosis present

## 2015-09-20 DIAGNOSIS — D72829 Elevated white blood cell count, unspecified: Secondary | ICD-10-CM | POA: Diagnosis present

## 2015-09-20 DIAGNOSIS — T502X5A Adverse effect of carbonic-anhydrase inhibitors, benzothiadiazides and other diuretics, initial encounter: Secondary | ICD-10-CM | POA: Diagnosis present

## 2015-09-20 DIAGNOSIS — K4090 Unilateral inguinal hernia, without obstruction or gangrene, not specified as recurrent: Secondary | ICD-10-CM | POA: Diagnosis present

## 2015-09-20 DIAGNOSIS — D62 Acute posthemorrhagic anemia: Secondary | ICD-10-CM | POA: Diagnosis present

## 2015-09-20 DIAGNOSIS — K652 Spontaneous bacterial peritonitis: Secondary | ICD-10-CM | POA: Diagnosis present

## 2015-09-20 DIAGNOSIS — B9789 Other viral agents as the cause of diseases classified elsewhere: Secondary | ICD-10-CM | POA: Diagnosis present

## 2015-09-20 DIAGNOSIS — K7290 Hepatic failure, unspecified without coma: Secondary | ICD-10-CM

## 2015-09-20 DIAGNOSIS — K7031 Alcoholic cirrhosis of liver with ascites: Principal | ICD-10-CM

## 2015-09-20 DIAGNOSIS — G4700 Insomnia, unspecified: Secondary | ICD-10-CM | POA: Diagnosis present

## 2015-09-20 DIAGNOSIS — Z800 Family history of malignant neoplasm of digestive organs: Secondary | ICD-10-CM | POA: Diagnosis not present

## 2015-09-20 DIAGNOSIS — Z79891 Long term (current) use of opiate analgesic: Secondary | ICD-10-CM | POA: Diagnosis not present

## 2015-09-20 DIAGNOSIS — Z8711 Personal history of peptic ulcer disease: Secondary | ICD-10-CM | POA: Diagnosis not present

## 2015-09-20 DIAGNOSIS — D649 Anemia, unspecified: Secondary | ICD-10-CM | POA: Diagnosis not present

## 2015-09-20 DIAGNOSIS — K922 Gastrointestinal hemorrhage, unspecified: Secondary | ICD-10-CM | POA: Diagnosis present

## 2015-09-20 DIAGNOSIS — J9601 Acute respiratory failure with hypoxia: Secondary | ICD-10-CM

## 2015-09-20 DIAGNOSIS — Z8379 Family history of other diseases of the digestive system: Secondary | ICD-10-CM | POA: Diagnosis not present

## 2015-09-20 DIAGNOSIS — Z6826 Body mass index (BMI) 26.0-26.9, adult: Secondary | ICD-10-CM

## 2015-09-20 DIAGNOSIS — K7460 Unspecified cirrhosis of liver: Secondary | ICD-10-CM | POA: Diagnosis present

## 2015-09-20 DIAGNOSIS — Z87891 Personal history of nicotine dependence: Secondary | ICD-10-CM | POA: Diagnosis not present

## 2015-09-20 DIAGNOSIS — E119 Type 2 diabetes mellitus without complications: Secondary | ICD-10-CM | POA: Diagnosis present

## 2015-09-20 DIAGNOSIS — F1020 Alcohol dependence, uncomplicated: Secondary | ICD-10-CM | POA: Diagnosis present

## 2015-09-20 DIAGNOSIS — Z9049 Acquired absence of other specified parts of digestive tract: Secondary | ICD-10-CM | POA: Diagnosis not present

## 2015-09-20 DIAGNOSIS — E871 Hypo-osmolality and hyponatremia: Secondary | ICD-10-CM | POA: Diagnosis present

## 2015-09-20 DIAGNOSIS — K921 Melena: Secondary | ICD-10-CM

## 2015-09-20 DIAGNOSIS — Z79899 Other long term (current) drug therapy: Secondary | ICD-10-CM | POA: Diagnosis not present

## 2015-09-20 LAB — RAPID URINE DRUG SCREEN, HOSP PERFORMED
AMPHETAMINES: NOT DETECTED
BARBITURATES: NOT DETECTED
Benzodiazepines: NOT DETECTED
Cocaine: NOT DETECTED
Opiates: NOT DETECTED
TETRAHYDROCANNABINOL: POSITIVE — AB

## 2015-09-20 LAB — ALBUMIN, FLUID (OTHER)

## 2015-09-20 LAB — CBC WITH DIFFERENTIAL/PLATELET
Basophils Absolute: 0 10*3/uL (ref 0.0–0.1)
Basophils Absolute: 0 10*3/uL (ref 0.0–0.1)
Basophils Relative: 0 %
Basophils Relative: 0 %
EOS ABS: 0.1 10*3/uL (ref 0.0–0.7)
EOS PCT: 1 %
Eosinophils Absolute: 0.1 10*3/uL (ref 0.0–0.7)
Eosinophils Relative: 1 %
HCT: 21.5 % — ABNORMAL LOW (ref 39.0–52.0)
HEMATOCRIT: 24.7 % — AB (ref 39.0–52.0)
HEMOGLOBIN: 6.6 g/dL — AB (ref 13.0–17.0)
HEMOGLOBIN: 8 g/dL — AB (ref 13.0–17.0)
LYMPHS ABS: 1.7 10*3/uL (ref 0.7–4.0)
LYMPHS ABS: 0.7 10*3/uL (ref 0.7–4.0)
LYMPHS PCT: 10 %
LYMPHS PCT: 4 %
MCH: 24.5 pg — AB (ref 26.0–34.0)
MCH: 26.1 pg (ref 26.0–34.0)
MCHC: 30.7 g/dL (ref 30.0–36.0)
MCHC: 32.4 g/dL (ref 30.0–36.0)
MCV: 79.9 fL (ref 78.0–100.0)
MCV: 80.7 fL (ref 78.0–100.0)
MONOS PCT: 8 %
Monocytes Absolute: 1.3 10*3/uL — ABNORMAL HIGH (ref 0.1–1.0)
Monocytes Absolute: 2.1 10*3/uL — ABNORMAL HIGH (ref 0.1–1.0)
Monocytes Relative: 12 %
NEUTROS ABS: 13.9 10*3/uL — AB (ref 1.7–7.7)
NEUTROS PCT: 81 %
NEUTROS PCT: 83 %
Neutro Abs: 13.4 10*3/uL — ABNORMAL HIGH (ref 1.7–7.7)
Platelets: 190 10*3/uL (ref 150–400)
Platelets: 125 10*3/uL — ABNORMAL LOW (ref 150–400)
RBC: 2.69 MIL/uL — AB (ref 4.22–5.81)
RBC: 3.06 MIL/uL — AB (ref 4.22–5.81)
RDW: 18.2 % — ABNORMAL HIGH (ref 11.5–15.5)
RDW: 17.4 % — ABNORMAL HIGH (ref 11.5–15.5)
WBC: 16.4 10*3/uL — AB (ref 4.0–10.5)
WBC: 16.8 10*3/uL — AB (ref 4.0–10.5)

## 2015-09-20 LAB — RETICULOCYTES
RBC.: 3.08 MIL/uL — ABNORMAL LOW (ref 4.22–5.81)
RETIC CT PCT: 5.2 % — AB (ref 0.4–3.1)
Retic Count, Absolute: 160.2 10*3/uL (ref 19.0–186.0)

## 2015-09-20 LAB — COMPREHENSIVE METABOLIC PANEL
ALK PHOS: 90 U/L (ref 38–126)
ALK PHOS: 89 U/L (ref 38–126)
ALT: 23 U/L (ref 17–63)
ALT: 28 U/L (ref 17–63)
ANION GAP: 14 (ref 5–15)
AST: 35 U/L (ref 15–41)
AST: 38 U/L (ref 15–41)
Albumin: 2.7 g/dL — ABNORMAL LOW (ref 3.5–5.0)
Albumin: 2.7 g/dL — ABNORMAL LOW (ref 3.5–5.0)
Anion gap: 10 (ref 5–15)
BUN: 25 mg/dL — ABNORMAL HIGH (ref 6–20)
BUN: 21 mg/dL — ABNORMAL HIGH (ref 6–20)
CALCIUM: 8.9 mg/dL (ref 8.9–10.3)
CHLORIDE: 99 mmol/L — AB (ref 101–111)
CO2: 17 mmol/L — ABNORMAL LOW (ref 22–32)
CO2: 21 mmol/L — ABNORMAL LOW (ref 22–32)
CREATININE: 0.99 mg/dL (ref 0.61–1.24)
Calcium: 8.7 mg/dL — ABNORMAL LOW (ref 8.9–10.3)
Chloride: 97 mmol/L — ABNORMAL LOW (ref 101–111)
Creatinine, Ser: 0.98 mg/dL (ref 0.61–1.24)
GFR calc non Af Amer: >60 mL/min (ref >60)
Glucose, Bld: 145 mg/dL — ABNORMAL HIGH (ref 65–99)
Glucose, Bld: 179 mg/dL — ABNORMAL HIGH (ref 65–99)
Potassium: 4.8 mmol/L (ref 3.5–5.1)
Potassium: 3.9 mmol/L (ref 3.5–5.1)
SODIUM: 128 mmol/L — AB (ref 135–145)
SODIUM: 130 mmol/L — AB (ref 135–145)
TOTAL PROTEIN: 5.5 g/dL — AB (ref 6.5–8.1)
Total Bilirubin: 1.6 mg/dL — ABNORMAL HIGH (ref 0.3–1.2)
Total Bilirubin: 4.1 mg/dL — ABNORMAL HIGH (ref 0.3–1.2)
Total Protein: 5.9 g/dL — ABNORMAL LOW (ref 6.5–8.1)

## 2015-09-20 LAB — GLUCOSE, CAPILLARY
Glucose-Capillary: 171 mg/dL — ABNORMAL HIGH (ref 65–99)
Glucose-Capillary: 215 mg/dL — ABNORMAL HIGH (ref 65–99)

## 2015-09-20 LAB — IRON AND TIBC
IRON: 168 ug/dL (ref 45–182)
Saturation Ratios: 61 % — ABNORMAL HIGH (ref 17.9–39.5)
TIBC: 274 ug/dL (ref 250–450)
UIBC: 106 ug/dL

## 2015-09-20 LAB — BODY FLUID CELL COUNT WITH DIFFERENTIAL
EOS FL: 0 %
Lymphs, Fluid: 2 %
Monocyte-Macrophage-Serous Fluid: 8 % — ABNORMAL LOW (ref 50–90)
NEUTROPHIL FLUID: 90 % — AB (ref 0–25)
Total Nucleated Cell Count, Fluid: 1502 cu mm — ABNORMAL HIGH (ref 0–1000)

## 2015-09-20 LAB — I-STAT TROPONIN, ED: Troponin i, poc: 0 ng/mL (ref 0.00–0.08)

## 2015-09-20 LAB — FOLATE: FOLATE: 51.9 ng/mL (ref >5.9)

## 2015-09-20 LAB — VITAMIN B12: VITAMIN B 12: 795 pg/mL (ref 180–914)

## 2015-09-20 LAB — GRAM STAIN

## 2015-09-20 LAB — ETHANOL

## 2015-09-20 LAB — PROTEIN, BODY FLUID: Total protein, fluid: <3 g/dL

## 2015-09-20 LAB — AMMONIA: AMMONIA: 129 umol/L — AB (ref 9–35)

## 2015-09-20 LAB — MRSA PCR SCREENING: MRSA BY PCR: NEGATIVE

## 2015-09-20 LAB — PROTIME-INR
INR: 1.73 — AB (ref 0.00–1.49)
PROTHROMBIN TIME: 20.2 s — AB (ref 11.6–15.2)

## 2015-09-20 LAB — FERRITIN: Ferritin: 18 ng/mL — ABNORMAL LOW (ref 24–336)

## 2015-09-20 LAB — PREPARE RBC (CROSSMATCH)

## 2015-09-20 MED ORDER — PANTOPRAZOLE SODIUM 40 MG IV SOLR
40.0000 mg | Freq: Two times a day (BID) | INTRAVENOUS | Status: DC
  Administered 2015-09-20 – 2015-09-22 (×5): 40 mg via INTRAVENOUS
  Filled 2015-09-20 (×7): qty 40

## 2015-09-20 MED ORDER — LIDOCAINE HCL (PF) 1 % IJ SOLN
INTRAMUSCULAR | Status: AC
  Filled 2015-09-20: qty 10

## 2015-09-20 MED ORDER — SODIUM CHLORIDE 0.9 % IV SOLN: Freq: Once | INTRAVENOUS | Status: DC

## 2015-09-20 MED ORDER — PIPERACILLIN-TAZOBACTAM 3.375 G IVPB
3.3750 g | Freq: Three times a day (TID) | INTRAVENOUS | Status: DC
  Administered 2015-09-20 – 2015-09-21 (×4): 3.375 g via INTRAVENOUS
  Filled 2015-09-20 (×6): qty 50

## 2015-09-20 MED ORDER — ZOLPIDEM TARTRATE 5 MG PO TABS
10.0000 mg | ORAL_TABLET | Freq: Every day | ORAL | Status: DC
  Administered 2015-09-20 – 2015-09-23 (×4): 10 mg via ORAL
  Filled 2015-09-20 (×4): qty 2

## 2015-09-20 MED ORDER — ADULT MULTIVITAMIN W/MINERALS CH
1.0000 | ORAL_TABLET | Freq: Every day | ORAL | Status: DC
  Administered 2015-09-20 – 2015-09-24 (×4): 1 via ORAL
  Filled 2015-09-20 (×5): qty 1

## 2015-09-20 MED ORDER — FERROUS SULFATE 325 (65 FE) MG PO TABS
325.0000 mg | ORAL_TABLET | Freq: Every day | ORAL | Status: DC
  Administered 2015-09-20 – 2015-09-22 (×3): 325 mg via ORAL
  Filled 2015-09-20 (×5): qty 1

## 2015-09-20 MED ORDER — ENSURE ENLIVE PO LIQD
237.0000 mL | Freq: Two times a day (BID) | ORAL | Status: DC
  Administered 2015-09-21 – 2015-09-24 (×5): 237 mL via ORAL

## 2015-09-20 MED ORDER — FUROSEMIDE 20 MG PO TABS
120.0000 mg | ORAL_TABLET | Freq: Two times a day (BID) | ORAL | Status: DC
  Administered 2015-09-20: 120 mg via ORAL
  Filled 2015-09-20: qty 6

## 2015-09-20 MED ORDER — FUROSEMIDE 80 MG PO TABS
120.0000 mg | ORAL_TABLET | ORAL | Status: DC
  Administered 2015-09-21 – 2015-09-24 (×4): 120 mg via ORAL
  Filled 2015-09-20 (×5): qty 1

## 2015-09-20 MED ORDER — SODIUM CHLORIDE 0.9 % IJ SOLN
3.0000 mL | Freq: Two times a day (BID) | INTRAMUSCULAR | Status: DC
  Administered 2015-09-20 – 2015-09-22 (×5): 3 mL via INTRAVENOUS

## 2015-09-20 MED ORDER — FUROSEMIDE 80 MG PO TABS
160.0000 mg | ORAL_TABLET | Freq: Every day | ORAL | Status: DC
  Administered 2015-09-20 – 2015-09-23 (×4): 160 mg via ORAL
  Filled 2015-09-20: qty 4
  Filled 2015-09-20 (×3): qty 2

## 2015-09-20 MED ORDER — ONDANSETRON HCL 4 MG PO TABS: 4.0000 mg | ORAL_TABLET | Freq: Four times a day (QID) | ORAL | Status: DC | PRN

## 2015-09-20 MED ORDER — POTASSIUM CHLORIDE CRYS ER 20 MEQ PO TBCR
20.0000 meq | EXTENDED_RELEASE_TABLET | Freq: Every day | ORAL | Status: DC
  Administered 2015-09-20 – 2015-09-24 (×4): 20 meq via ORAL
  Filled 2015-09-20 (×5): qty 1

## 2015-09-20 MED ORDER — ONDANSETRON HCL 4 MG/2ML IJ SOLN: 4.0000 mg | Freq: Four times a day (QID) | INTRAMUSCULAR | Status: DC | PRN

## 2015-09-20 MED ORDER — OXYCODONE HCL 5 MG PO TABS
5.0000 mg | ORAL_TABLET | ORAL | Status: DC | PRN
  Administered 2015-09-20 – 2015-09-23 (×4): 5 mg via ORAL
  Filled 2015-09-20 (×4): qty 1

## 2015-09-20 MED ORDER — SPIRONOLACTONE 25 MG PO TABS
200.0000 mg | ORAL_TABLET | Freq: Two times a day (BID) | ORAL | Status: DC
  Administered 2015-09-20 (×2): 200 mg via ORAL
  Filled 2015-09-20: qty 2
  Filled 2015-09-20 (×2): qty 4
  Filled 2015-09-20: qty 8
  Filled 2015-09-20 (×2): qty 2

## 2015-09-20 NOTE — ED Notes (Signed)
Pt on the phone with is brother mark.

## 2015-09-20 NOTE — ED Notes (Signed)
Pt refused VS and monitoring at 3am when I attempted

## 2015-09-20 NOTE — ED Notes (Signed)
Upon pt request called his mother, but she did not answer. Pt is very verbally abusive and degrading to all staff. Pt states that he will sue all of the medical staff for not caring for him.

## 2015-09-20 NOTE — Procedures (Signed)
Successful US guided paracentesis from LL[facility].  Yielded 6.3L of clear yellow fluid.  No immediate complications.  Pt tolerated well.   Specimen was sent for labs.  Brayton El PA-C 09/20/2015 2:17 PM

## 2015-09-20 NOTE — ED Notes (Signed)
Pt was swabbing his mouth with wound swab which he found in room, will call pharmacy to find out if this is concerning

## 2015-09-20 NOTE — Progress Notes (Signed)
09/20/15 Pt arrives on unit very agitated and verbally abusive to staff. Insists we call his mother. Left message on phone number listed. Brother Loraine Leriche called the unit shortly thereafter and asked that we not call the mother anymore. She is elderly, home bound and has had multiple health issues recently. Please call brother Loraine Leriche for anything. His number is listed in chart.

## 2015-09-20 NOTE — ED Notes (Signed)
Pt with multiple complaints called EMS initially due to lack of heat at home causing him to worry about hypothermia, pt was found nude in hoard like situation in his home.  Pt was verbally abusive to EMS en route to hospital.  VSS (118/60, alert and oriented.  Per EMS pt displaying obvious psych problems.  Pt is demanding hospital admission.

## 2015-09-20 NOTE — ED Provider Notes (Signed)
CSN: [id number]     Arrival date & time 09/20/15  0033 History  By signing my name below, I, Iona Beard, attest that this documentation has been prepared under the direction and in the presence of Tomasita Crumble, MD.   Electronically Signed: Iona Beard, ED Scribe. 09/20/2015. 1:39 AM    Chief Complaint  Patient presents with  . Illness    The history is provided by the patient. No language interpreter was used.   HPI Comments: Clayton Watson is a 55 y.o. male with PMHx of alcoholism, anxiety, insomnia, and cirrhosis of liver who presents to the Emergency Department complaining of gradual onset, constant shortness of breath and symptomatic anemia, onset tonight. Pt has hx cirrhosis of the liver and is currently on a transplant list. Pt states that the heat in his house was out and that he would not have survived the night prompting him to come to the ED. Pt states that he wants to spend the night and be transported to [facility] in the morning.  Past Medical History  Diagnosis Date  . Cirrhosis of liver (HCC)   . Ascitic fluid   . Alcoholism (HCC)   . Anxiety   . History of stomach ulcers   . Insomnia    Past Surgical History  Procedure Laterality Date  . Appendectomy    . Hernia repair      inguinal hernia x 2   Family History  Problem Relation Age of Onset  . Colon cancer Neg Hx   . Liver disease Brother     Hep C-Blood Transfusion    Social History  Substance Use Topics  . Smoking status: Former Smoker -- 0.00 packs/day    Quit date: 12/26/2011  . Smokeless tobacco: Never Used  . Alcohol Use: No    Review of Systems 10 Systems reviewed and all are negative for acute change except as noted in the HPI.   Allergies  Nsaids  Home Medications   Prior to Admission medications   Medication Sig Start Date End Date Taking? Authorizing Provider  furosemide (LASIX) 40 MG tablet Take 120-160 mg by mouth 2 (two) times daily.    Historical Provider, MD  Multiple  Vitamin (MULITIVITAMIN WITH MINERALS) TABS Take 1 tablet by mouth daily.    Historical Provider, MD  potassium chloride SA (K-DUR,KLOR-CON) 20 MEQ tablet Take 600 mEq by mouth daily.    Historical Provider, MD  spironolactone (ALDACTONE) 100 MG tablet Take 1 tablet (100 mg total) by mouth 4 (four) times daily. Patient taking differently: Take 400 mg by mouth 2 (two) times daily.  03/03/13   Roxy Horseman, PA-C  zolpidem (AMBIEN) 10 MG tablet Take 1 tablet (10 mg total) by mouth at bedtime. 03/03/13   Roxy Horseman, PA-C   BP 124/65 mmHg  Pulse 117  Temp(Src) 98.9 F (37.2 C) (Oral)  Resp 18  Ht  (1.727 m)  Wt 175 lb (79.379 kg)  BMI 26.61 kg/m2  SpO2 100% Physical Exam  Constitutional: He is oriented to person, place, and time. Vital signs are normal. He appears well-developed and well-nourished.  Non-toxic appearance. He does not appear ill. No distress.  HENT:  Head: Normocephalic and atraumatic.  Nose: Nose normal.  Mouth/Throat: Oropharynx is clear and moist. No oropharyngeal exudate.  Oral jaundice.   Eyes: Conjunctivae and EOM are normal. Pupils are equal, round, and reactive to light. Scleral icterus is present.  Neck: Normal range of motion. Neck supple. No tracheal deviation, no edema, no  erythema and normal range of motion present. No thyroid mass and no thyromegaly present.  Cardiovascular: Regular rhythm, S1 normal, S2 normal, normal heart sounds, intact distal pulses and normal pulses.  Tachycardia present.  Exam reveals no gallop and no friction rub.   No murmur heard. Pulmonary/Chest: Effort normal and breath sounds normal. No respiratory distress. He has no wheezes. He has no rhonchi. He has no rales.  Abdominal: Soft. Normal appearance and bowel sounds are normal. He exhibits distension. He exhibits no ascites and no mass. There is no hepatosplenomegaly. There is no tenderness. There is no rebound, no guarding and no CVA tenderness.  Musculoskeletal: Normal range of  motion. He exhibits no edema or tenderness.  Lymphadenopathy:    He has no cervical adenopathy.  Neurological: He is alert and oriented to person, place, and time. He has normal strength. No cranial nerve deficit or sensory deficit.  Skin: Skin is warm, dry and intact. No petechiae and no rash noted. He is not diaphoretic. No erythema. No pallor.  Nursing note and vitals reviewed.   ED Course  Procedures  DIAGNOSTIC STUDIES: Oxygen Saturation is 100% on RA, normal by my interpretation.    COORDINATION OF CARE: 1:08 AM-Discussed treatment plan which includes CXR, CBC with differential, CMP, and drug screen with pt at bedside and pt agreed to plan.   Labs Review Labs Reviewed  CBC WITH DIFFERENTIAL/PLATELET - Abnormal; Notable for the following:    WBC 16.4 (*)    RBC 2.69 (*)    Hemoglobin 6.6 (*)    HCT 21.5 (*)    MCH 24.5 (*)    RDW 18.2 (*)    Neutro Abs 13.4 (*)    Monocytes Absolute 1.3 (*)    All other components within normal limits  COMPREHENSIVE METABOLIC PANEL - Abnormal; Notable for the following:    Sodium 128 (*)    Chloride 97 (*)    CO2 17 (*)    Glucose, Bld 145 (*)    BUN 25 (*)    Total Protein 5.5 (*)    Albumin 2.7 (*)    Total Bilirubin 1.6 (*)    All other components within normal limits  PROTIME-INR - Abnormal; Notable for the following:    Prothrombin Time 20.2 (*)    INR 1.73 (*)    All other components within normal limits  URINE RAPID DRUG SCREEN, HOSP PERFORMED - Abnormal; Notable for the following:    Tetrahydrocannabinol POSITIVE (*)    All other components within normal limits  AMMONIA - Abnormal; Notable for the following:    Ammonia 129 (*)    All other components within normal limits  ETHANOL  I-STAT TROPOININ, ED  TYPE AND SCREEN    Imaging Review Dg Chest 2 View  09/20/2015  CLINICAL DATA:  Shortness of breath.  Liver failure.  Ascites. EXAM: CHEST  2 VIEW COMPARISON:  11/26/2011 FINDINGS: Shallow inspiration. Normal heart  size and pulmonary vascularity. No focal airspace disease or consolidation in the lungs. No blunting of costophrenic angles. No pneumothorax. Mediastinal contours appear intact. Old right rib fractures. Calcified and tortuous aorta. IMPRESSION: No active cardiopulmonary disease. Electronically Signed   By: Burman Nieves M.D.   On: 09/20/2015 02:17     EKG Interpretation   Date/Time:  Tuesday September 20 2015 02:25:07 EST Ventricular Rate:  122 PR Interval:  160 QRS Duration: 81 QT Interval:  338 QTC Calculation: 481 R Axis:   36 Text Interpretation:  Sinus tachycardia Posterior infarct,  old Borderline  T abnormalities, inferior leads No significant change since last tracing  Confirmed by Erroll Luna [phone]) on 09/20/2015 3:35:20 AM      MDM   Final diagnoses:  None   Patient presents to the ED for SOB and possible anemia.  He has a recent admission for the same but left AMA.  Labs today reveal a hemoglobin of 6.6, requiring admission. He also has evidence of liver failure with an INR of 1.73. I spoke with the hospitalist, Dr.Kakrakandy, except the patient to telemetry.   I personally performed the services described in this documentation, which was scribed in my presence. The recorded information has been reviewed and is accurate.      Tomasita Crumble, MD 09/20/15 [phone]

## 2015-09-20 NOTE — ED Notes (Signed)
Patient transported to X-ray 

## 2015-09-20 NOTE — Care Management Note (Signed)
Case Management Note  Patient Details  Name: Oneal Schoenberger MRN: [id number] Date of Birth: 1961/02/22  Subjective/Objective:      Date: 09/19/15 Spoke with patient at the bedside.  Introduced self as Sports coach and explained role in discharge planning and how to be reached.  Verified patient lives in [place], alone. Expressed potential need for no other DME.  Verified patient anticipates to go  home alone,  at time of discharge, patient left AMA yesterday.  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 Verified patient has PCP [name] at [facility].  Patient states he has a 55 year old daughter but APS took her away from him and now she lives with his Mother. There are a lot of family dynamics , per Secretary states patient brother called and stated for no one to call his mother because she is 55 years old.  If patient wants to call her , that is up to him but no one else should be calling her.  The brother does not have a password so we are not to talk to him either unless patient gives a password. Patient is for transfer to floor today, He states he wants to be transferred to [facility].     Plan: CM will continue to follow for discharge planning and [facility] resources.               Action/Plan: Acute Resp Failure, Severe Anemia hgb was 6.6, received 2 units , Ascites- for a parancetesis. And right inguinal hernia.   Expected Discharge Date:                  Expected Discharge Plan:  Home/Self Care  In-House Referral:     Discharge planning Services  CM Consult  Post Acute Care Choice:    Choice offered to:     DME Arranged:    DME Agency:     HH Arranged:    HH Agency:     Status of Service:  In process, will continue to follow  Medicare Important Message Given:    Date Medicare IM Given:    Medicare IM give by:    Date Additional Medicare IM Given:    Additional Medicare Important Message  give by:     If discussed at Long Length of Stay Meetings, dates discussed:    Additional Comments:  Leone Haven, RN 09/20/2015, 12:11 PM

## 2015-09-20 NOTE — ED Notes (Signed)
Patient is disappointed and frustrated at this point, but allows vitals prior to moving to new room assignment.

## 2015-09-20 NOTE — H&P (Addendum)
[facility] History and Physical  Clayton Watson:[id number] DOB: 1961-05-02 DOA: 09/20/2015  Referring physician: Dr.ONI. PCP: [name], MD  Specialists: In[facility] Catalina in [place].  Chief Complaint: Shortness of breath and increasing abdominal distention.  HPI: Clayton Watson is a 55 y.o. male history of liver cirrhosis with ascites, previous history of alcoholism who had left AGAINST MEDICAL ADVICE 2 days ago after being admitted for anemia and ascites  Presents to the ER because of increasing abdominal girth and shortness of breath. During his last admission 4 days ago patient had paracentesis done and was about to be seen by gastroenterologist and patient left AMA. Patient also increasing shortness of breath and increasing abdominal girth. Patient otherwise denies any productive cough fever chills chest pain. On exam patient has distended abdomen. Chest x-ray does not show anything acute. Labs show anemia. Hemoglobin around 6 g. Patient has been admitted for anemia probably from chronic GI bleed and ascites from decompensated cirrhosis. Patient states he has been compliant with his Lasix and spironolactone.  Review of Systems: As presented in the history of presenting illness, rest negative.  Past Medical History  Diagnosis Date  . Cirrhosis of liver (HCC)   . Ascitic fluid   . Alcoholism (HCC)   . Anxiety   . History of stomach ulcers   . Insomnia    Past Surgical History  Procedure Laterality Date  . Appendectomy    . Hernia repair      inguinal hernia x 2   Social History:  reports that he quit smoking about 3 years ago. He has never used smokeless tobacco. He reports that he does not drink alcohol or use illicit drugs. Where does patient live  Home. Can patient participate in ADLs? Yes.   Allergies  Allergen Reactions  . Nsaids     Bleeding due to ulcer     Family History:  Family History  Problem Relation Age of Onset  . Colon cancer Neg Hx   . Liver  disease Brother     Hep C-Blood Transfusion       Prior to Admission medications   Medication Sig Start Date End Date Taking? Authorizing Provider  ferrous sulfate 325 (65 FE) MG tablet Take 325 mg by mouth daily with breakfast.   Yes Historical Provider, MD  furosemide (LASIX) 40 MG tablet Take 120-160 mg by mouth 2 (two) times daily. Take  in the morning and  in the evening.   Yes Historical Provider, MD  Multiple Vitamin (MULITIVITAMIN WITH MINERALS) TABS Take 1 tablet by mouth daily.   Yes Historical Provider, MD  oxycodone (OXY-IR) 5 MG capsule Take 5 mg by mouth every 4 (four) hours as needed for pain.   Yes Historical Provider, MD  potassium chloride SA (K-DUR,KLOR-CON) 20 MEQ tablet Take 20 mEq by mouth daily.    Yes Historical Provider, MD  spironolactone (ALDACTONE) 100 MG tablet Take 1 tablet (100 mg total) by mouth 4 (four) times daily. Patient taking differently: Take 200 mg by mouth 2 (two) times daily.  03/03/13  Yes Roxy Horseman, PA-C  zolpidem (AMBIEN) 10 MG tablet Take 1 tablet (10 mg total) by mouth at bedtime. 03/03/13  Yes Roxy Horseman, PA-C    Physical Exam: Filed Vitals:   09/20/15 0037 09/20/15 0450 09/20/15 0500  BP: 124/65 108/61 97/56  Pulse: 117 117 120  Temp: 98.9 F (37.2 C) 99 F (37.2 C)   TempSrc: Oral Oral   Resp: 18 17   Height: 5'  8" (1.727 m)    Weight: 79.379 kg (175 lb)    SpO2: 100% 99% 99%     General - moderately built and nourished.  Eyes: Anicteric mild pallor.  ENT: No discharge from the ears eyes nose or mouth.  Neck: No mass felt.  Cardiovascular: S1 and S2 heard tachycardic.  Respiratory: No rhonchi or crepitations.  Abdomen: Distended nontender bowel sounds present. Left inguinal hernia.  Skin: No rash.  Musculoskeletal: No edema.  Psychiatric: Appears normal.  Neurologic: Alert awake oriented to time place and person. Moves all extremities.  Labs on Admission:  Basic Metabolic Panel:  Recent  Labs Lab 09/16/15 2235 09/17/15 0515 09/20/15 0158  NA 131* 131* 128*  K 4.0 3.7 4.8  CL 100* 100* 97*  CO2 21* 21* 17*  GLUCOSE 173* 121* 145*  BUN 10 11 25*  CREATININE 1.13 1.00 0.99  CALCIUM 8.4* 8.0* 8.9   Liver Function Tests:  Recent Labs Lab 09/16/15 2235 09/20/15 0158  AST 38 35  ALT 27 23  ALKPHOS 118 90  BILITOT 1.2 1.6*  PROT 5.8* 5.5*  ALBUMIN 2.6* 2.7*    Recent Labs Lab 09/16/15 2235  LIPASE 57*    Recent Labs Lab 09/20/15 0158  AMMONIA 129*   CBC:  Recent Labs Lab 09/16/15 2235 09/17/15 0515 09/17/15 1247 09/20/15 0158  WBC 7.7 7.8  --  16.4*  NEUTROABS  --   --   --  13.4*  HGB 6.7* 6.7* 6.2* 6.6*  HCT 22.1* 21.5* 20.7* 21.5*  MCV 82.8 82.7  --  79.9  PLT 136* 130*  --  190   Cardiac Enzymes: No results for input(s): CKTOTAL, CKMB, CKMBINDEX, TROPONINI in the last 168 hours.  BNP (last 3 results) No results for input(s): BNP in the last 8760 hours.  ProBNP (last 3 results) No results for input(s): PROBNP in the last 8760 hours.  CBG: No results for input(s): GLUCAP in the last 168 hours.  Radiological Exams on Admission: Dg Chest 2 View  09/20/2015  CLINICAL DATA:  Shortness of breath.  Liver failure.  Ascites. EXAM: CHEST  2 VIEW COMPARISON:  11/26/2011 FINDINGS: Shallow inspiration. Normal heart size and pulmonary vascularity. No focal airspace disease or consolidation in the lungs. No blunting of costophrenic angles. No pneumothorax. Mediastinal contours appear intact. Old right rib fractures. Calcified and tortuous aorta. IMPRESSION: No active cardiopulmonary disease. Electronically Signed   By: Burman Nieves M.D.   On: 09/20/2015 02:17    EKG: Independently reviewed. Sinus tachycardia.  Assessment/Plan Principal Problem:   Acute respiratory failure with hypoxia (HCC) Active Problems:   Anemia   Alcoholic cirrhosis of liver with ascites (HCC)   Decompensated hepatic cirrhosis (HCC)   Symptomatic  anemia   1. Acute respiratory failure and hypoxia probably from a combination of severe anemia and ascites - at this time 2 units of packed red blood cell transfusion has been ordered. I have also ordered ultrasound-guided paracentesis. 2. Severe anemia probably from chronic GI bleed - reviewing patient's chart patient has had previous banding done by Dr. Pollyann Samples. I have placed patient on Protonix. I have ordered 2 units of packed red blood cell transfusion. Consult GI in in for possible EGD. Patient will be kept nothing by mouth except medications. 3. Ascites with decompensated liver cirrhosis - I have ordered ultrasound-guided paracentesis. Patient's recent paracentesis shows elevated WBC count concerning for SBP. I have placed patient on Zosyn for now. Patient is on Lasix and spironolactone which will  be continued if patient's blood pressure allows. 4. Left inguinal hernia - being followed at [facility].   DVT Prophylaxis SCDs.  Code Status: Full code.  Family Communication: Discussed with patient.  Disposition Plan: Admit to inpatient.    Ruger Saxer N. [facility] Pager [phone].  If 7PM-7AM, please contact night-coverage [email or web address] Password [facility] 09/20/2015, 5:32 AM

## 2015-09-20 NOTE — Progress Notes (Signed)
ANTIBIOTIC CONSULT NOTE - INITIAL  Pharmacy Consult for Zosyn  Indication: Intra-abdominal infection  Allergies  Allergen Reactions  . Nsaids     Bleeding due to ulcer     Patient Measurements: Height:  (172.7 cm) Weight: 175 lb (79.379 kg) IBW/kg (Calculated) : 68.4  Vital Signs: Temp: 99 F (37.2 C) (01/24 0450) Temp Source: Oral (01/24 0450) BP: 97/56 mmHg (01/24 0500) Pulse Rate: 120 (01/24 0500)  Labs:  Recent Labs  09/17/15 1247 09/20/15 0158  WBC  --  16.4*  HGB 6.2* 6.6*  PLT  --  190  CREATININE  --  0.99   Estimated Creatinine Clearance: 82.5 mL/min (by C-G formula based on Cr of 0.99).  Medical History: Past Medical History  Diagnosis Date  . Cirrhosis of liver (HCC)   . Ascitic fluid   . Alcoholism (HCC)   . Anxiety   . History of stomach ulcers   . Insomnia     Assessment: 55 y/o M with alcoholic cirrhosis of the liver, has some shortness of breath, Hgb is 6.6, WBC is elevated at 16.4, starting Zosyn for intra-abdominal coverage  Plan:  -Zosyn 3.375G IV q8h to be infused over 4 hours -Trend WBC, temp, renal function -F/U infectious work-up  Abran Duke 09/20/2015,5:54 AM

## 2015-09-20 NOTE — Progress Notes (Addendum)
[facility] Progress Note   Clayton Watson  UJW:[id number]  DOB: [birth year]  DOA: 09/20/2015 PCP: [name], MD  Brief narrative: Clayton Watson is a 55 y.o. male history of liver cirrhosis with ascites, previous history of alcoholism who had left AGAINST MEDICAL ADVICE 2 days ago after being admitted for anemia and ascites. He returns for shortness of breath and increasing ascites.    Subjective: Wants to be discharged as soon as possible and would like to go to [facility]. Does not want an endoscopy. No cough or dyspnea.   Assessment/Plan: Principal Problem:   Acute respiratory failure with hypoxia  - likely due to abdominal distension- follow- no cough or dyspnea.   Active Problems:   Anemia - transfuse 2 U PRBC- h/o banding of esophageal varices - he does not want a GI work up- cont Ferrous sulfate- check Iron levels    Alcoholic cirrhosis of liver with ascites  - paracentesis ordered  Leukocytosis - started on Zosyn for possible SBP  Hyponatremia  -due to diuretics- follow     Decompensated hepatic cirrhosis  - states he is being evaluated for a liver transplant    Antibiotics: Anti-infectives    Start     Dose/Rate Route Frequency Ordered Stop   09/20/15 0600  piperacillin-tazobactam (ZOSYN) IVPB 3.375 g     3.375 g 12.5 mL/hr over 240 Minutes Intravenous 3 times per day 09/20/15 0558       Code Status:     Code Status Orders        Start     Ordered   09/20/15 0535  Full code   Continuous     09/20/15 0534    Code Status History    Date Active Date Inactive Code Status Order ID Comments User Context   09/17/2015  5:00 AM 09/18/2015  1:59 PM Full Code [id number]  Clydie Braun, MD ED   09/03/2014 11:38 PM 09/04/2014  2:50 PM Full Code [id number]  Pearson Grippe, MD Inpatient   08/03/2014  1:58 AM 08/04/2014  1:02 PM Full Code [id number]  [name], MD Inpatient   08/03/2014  1:04 AM 08/03/2014  1:58 AM Full Code [id number]  [name], MD ED   11/26/2011   5:29 PM 11/27/2011  1:44 PM DNR [id number]  Oretha Milch, MD Inpatient     Family Communication:  Disposition Plan: d/c home in 1-2 days DVT prophylaxis: SCD due to possible GI bleed Consultants:  Procedures:      Objective: Filed Weights   09/20/15 0037 09/20/15 0830  Weight: 79.379 kg (175 lb) 83.3 kg (183 lb 10.3 oz)    Intake/Output Summary (Last 24 hours) at 09/20/15 1202 Last data filed at 09/20/15 1000  Gross per 24 hour  Intake    345 ml  Output    900 ml  Net   -555 ml     Vitals Filed Vitals:   09/20/15 1000 09/20/15 1005 09/20/15 1020 09/20/15 1100  BP:      Pulse: 108  108 105  Temp:  97.7 F (36.5 C) 98 F (36.7 C) 97.9 F (36.6 C)  TempSrc:  Axillary Axillary Core (Comment)  Resp: Height:      Weight:      SpO2: 100%  100% 100%    Exam:  General:  Pt is alert, not in acute distress  HEENT: No icterus, No thrush, oral mucosa moist  Cardiovascular: regular rate and rhythm, S1/S2 No murmur  Respiratory:  clear to auscultation bilaterally   Abdomen: Soft, +Bowel sounds, non tender,  distended, no guarding  MSK: No cyanosis or clubbing- no pedal edema   Data Reviewed: Basic Metabolic Panel:  Recent Labs Lab 09/16/15 2235 09/17/15 0515 09/20/15 0158  NA 131* 131* 128*  K 4.0 3.7 4.8  CL 100* 100* 97*  CO2 21* 21* 17*  GLUCOSE 173* 121* 145*  BUN 10 11 25*  CREATININE 1.13 1.00 0.99  CALCIUM 8.4* 8.0* 8.9   Liver Function Tests:  Recent Labs Lab 09/16/15 2235 09/20/15 0158  AST 38 35  ALT 27 23  ALKPHOS 118 90  BILITOT 1.2 1.6*  PROT 5.8* 5.5*  ALBUMIN 2.6* 2.7*    Recent Labs Lab 09/16/15 2235  LIPASE 57*    Recent Labs Lab 09/20/15 0158  AMMONIA 129*   CBC:  Recent Labs Lab 09/16/15 2235 09/17/15 0515 09/17/15 1247 09/20/15 0158  WBC 7.7 7.8  --  16.4*  NEUTROABS  --   --   --  13.4*  HGB 6.7* 6.7* 6.2* 6.6*  HCT 22.1* 21.5* 20.7* 21.5*  MCV 82.8 82.7  --  79.9  PLT 136* 130*  --  190    Cardiac Enzymes: No results for input(s): CKTOTAL, CKMB, CKMBINDEX, TROPONINI in the last 168 hours. BNP (last 3 results) No results for input(s): BNP in the last 8760 hours.  ProBNP (last 3 results) No results for input(s): PROBNP in the last 8760 hours.  CBG:  Recent Labs Lab 09/20/15 1127  GLUCAP 171*    Recent Results (from the past 240 hour(s))  MRSA PCR Screening     Status: None   Collection Time: 09/20/15  8:49 AM  Result Value Ref Range Status   MRSA by PCR NEGATIVE NEGATIVE Final    Comment:        The GeneXpert MRSA Assay (FDA approved for NASAL specimens only), is one component of a comprehensive MRSA colonization surveillance program. It is not intended to diagnose MRSA infection nor to guide or monitor treatment for MRSA infections.      Studies: Dg Chest 2 View  09/20/2015  CLINICAL DATA:  Shortness of breath.  Liver failure.  Ascites. EXAM: CHEST  2 VIEW COMPARISON:  11/26/2011 FINDINGS: Shallow inspiration. Normal heart size and pulmonary vascularity. No focal airspace disease or consolidation in the lungs. No blunting of costophrenic angles. No pneumothorax. Mediastinal contours appear intact. Old right rib fractures. Calcified and tortuous aorta. IMPRESSION: No active cardiopulmonary disease. Electronically Signed   By: Burman Nieves M.D.   On: 09/20/2015 02:17    Scheduled Meds:  Scheduled Meds: . sodium chloride   Intravenous Once  . ferrous sulfate  325 mg Oral Q breakfast  . [START ON 09/21/2015] furosemide  120 mg Oral Q24H   And  . furosemide  160 mg Oral q1800  . multivitamin with minerals  1 tablet Oral Daily  . pantoprazole (PROTONIX) IV  40 mg Intravenous Q12H  . piperacillin-tazobactam (ZOSYN)  IV  3.375 g Intravenous 3 times per day  . potassium chloride SA  20 mEq Oral Daily  . sodium chloride  3 mL Intravenous Q12H  . spironolactone  200 mg Oral BID  . zolpidem  10 mg Oral QHS   Continuous Infusions:   Time spent on care  of this patient: 35 min   Poppy Mcafee, MD 09/20/2015, 12:02 PM  LOS: 0 days   [facility] Office  [phone] Pager - Text Page per [email or web address] If 7PM-7AM, please contact  night-coverage [email or web address]

## 2015-09-20 NOTE — ED Notes (Signed)
Pt is calm and cooperative.  Pt states that his house is on oil heat and he ran out of heating fuel and he was freezing and he felt that he would be "a corpse in the morning".  Pt states that he needs to spend the night and needs transport to [facility] in the am for his appointment.

## 2015-09-20 NOTE — ED Notes (Signed)
Admitting at bedside 

## 2015-09-21 LAB — COMPREHENSIVE METABOLIC PANEL
ALBUMIN: 2.3 g/dL — AB (ref 3.5–5.0)
ALK PHOS: 83 U/L (ref 38–126)
ALT: 25 U/L (ref 17–63)
ANION GAP: 10 (ref 5–15)
AST: 30 U/L (ref 15–41)
BILIRUBIN TOTAL: 2.3 mg/dL — AB (ref 0.3–1.2)
BUN: 21 mg/dL — AB (ref 6–20)
CALCIUM: 7.8 mg/dL — AB (ref 8.9–10.3)
CO2: 21 mmol/L — AB (ref 22–32)
Chloride: 96 mmol/L — ABNORMAL LOW (ref 101–111)
Creatinine, Ser: 1.19 mg/dL (ref 0.61–1.24)
GFR calc Af Amer: >60 mL/min (ref >60)
GFR calc non Af Amer: >60 mL/min (ref >60)
GLUCOSE: 145 mg/dL — AB (ref 65–99)
Potassium: 3.7 mmol/L (ref 3.5–5.1)
SODIUM: 127 mmol/L — AB (ref 135–145)
TOTAL PROTEIN: 5.3 g/dL — AB (ref 6.5–8.1)

## 2015-09-21 LAB — CBC
HCT: 22.9 % — ABNORMAL LOW (ref 39.0–52.0)
HCT: 25.2 % — ABNORMAL LOW (ref 39.0–52.0)
HEMOGLOBIN: 7.5 g/dL — AB (ref 13.0–17.0)
HEMOGLOBIN: 8.1 g/dL — AB (ref 13.0–17.0)
MCH: 26.6 pg (ref 26.0–34.0)
MCH: 26.2 pg (ref 26.0–34.0)
MCHC: 32.8 g/dL (ref 30.0–36.0)
MCHC: 32.1 g/dL (ref 30.0–36.0)
MCV: 81.2 fL (ref 78.0–100.0)
MCV: 81.6 fL (ref 78.0–100.0)
PLATELETS: 137 10*3/uL — AB (ref 150–400)
Platelets: 104 10*3/uL — ABNORMAL LOW (ref 150–400)
RBC: 2.82 MIL/uL — ABNORMAL LOW (ref 4.22–5.81)
RBC: 3.09 MIL/uL — AB (ref 4.22–5.81)
RDW: 18 % — AB (ref 11.5–15.5)
RDW: 18.2 % — ABNORMAL HIGH (ref 11.5–15.5)
WBC: 12.3 10*3/uL — ABNORMAL HIGH (ref 4.0–10.5)
WBC: 13.4 10*3/uL — ABNORMAL HIGH (ref 4.0–10.5)

## 2015-09-21 LAB — TYPE AND SCREEN
ABO/RH(D): O POS
Antibody Screen: POSITIVE
DAT, IGG: NEGATIVE
Donor AG Type: NEGATIVE
Donor AG Type: NEGATIVE
UNIT DIVISION: 0
UNIT DIVISION: 0

## 2015-09-21 LAB — GLUCOSE, CAPILLARY
GLUCOSE-CAPILLARY: 190 mg/dL — AB (ref 65–99)
GLUCOSE-CAPILLARY: 202 mg/dL — AB (ref 65–99)
Glucose-Capillary: 147 mg/dL — ABNORMAL HIGH (ref 65–99)
Glucose-Capillary: 149 mg/dL — ABNORMAL HIGH (ref 65–99)
Glucose-Capillary: 244 mg/dL — ABNORMAL HIGH (ref 65–99)
Glucose-Capillary: 200 mg/dL — ABNORMAL HIGH (ref 65–99)

## 2015-09-21 MED ORDER — INSULIN ASPART 100 UNIT/ML ~~LOC~~ SOLN
0.0000 [IU] | Freq: Three times a day (TID) | SUBCUTANEOUS | Status: DC
  Administered 2015-09-21: 3 [IU] via SUBCUTANEOUS
  Administered 2015-09-21: 2 [IU] via SUBCUTANEOUS
  Administered 2015-09-21: 5 [IU] via SUBCUTANEOUS
  Administered 2015-09-22 (×2): 3 [IU] via SUBCUTANEOUS

## 2015-09-21 MED ORDER — NADOLOL 20 MG PO TABS
20.0000 mg | ORAL_TABLET | Freq: Every day | ORAL | Status: DC
  Administered 2015-09-21 – 2015-09-24 (×2): 20 mg via ORAL
  Filled 2015-09-21 (×4): qty 1

## 2015-09-21 MED ORDER — PIPERACILLIN-TAZOBACTAM 3.375 G IVPB
3.3750 g | Freq: Three times a day (TID) | INTRAVENOUS | Status: DC
  Administered 2015-09-22 (×3): 3.375 g via INTRAVENOUS
  Filled 2015-09-21 (×7): qty 50

## 2015-09-21 MED ORDER — SODIUM CHLORIDE 0.9 % IV SOLN
125.0000 mg | Freq: Once | INTRAVENOUS | Status: AC
  Administered 2015-09-21: 125 mg via INTRAVENOUS
  Filled 2015-09-21: qty 10

## 2015-09-21 MED ORDER — SPIRONOLACTONE 25 MG PO TABS
50.0000 mg | ORAL_TABLET | Freq: Two times a day (BID) | ORAL | Status: DC
  Administered 2015-09-21 – 2015-09-22 (×3): 50 mg via ORAL
  Filled 2015-09-21 (×4): qty 2

## 2015-09-21 MED ORDER — INSULIN ASPART 100 UNIT/ML ~~LOC~~ SOLN: 0.0000 [IU] | Freq: Every day | SUBCUTANEOUS | Status: DC

## 2015-09-21 NOTE — Progress Notes (Signed)
Initial Nutrition Assessment  DOCUMENTATION CODES:   Severe malnutrition in context of chronic illness  INTERVENTION:  -RD to continue to monitor for needs -PT declined ONS at this time  NUTRITION DIAGNOSIS:   Malnutrition related to chronic illness as evidenced by severe depletion of body fat, severe depletion of muscle mass.  GOAL:   Patient will meet greater than or equal to 90% of their needs  MONITOR:   PO intake, I & O's, Labs, Weight trends  REASON FOR ASSESSMENT:   Malnutrition Screening Tool    ASSESSMENT:   Clayton Watson is a 55 y.o. male history of liver cirrhosis with ascites, previous history of alcoholism who had left AGAINST MEDICAL ADVICE 2 days ago after being admitted for anemia and ascites. He returns for shortness of breath and increasing ascites.   Spoke with pt at bedside. 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  Nutrition-Focused physical exam completed. Findings are severe fat depletion, severe muscle depletion, and severe perineal edema.   Patient had 6.3L drained during paracentesis yesterday, weight has changed from 181# on 1/21 - 154#. This is likely his dry weight. Per MD note, pt is refusing endoscopy at this point, regardless of hx of anemia. Pt wants to be d/c as soon as possible to go to [facility]. Upon admission, HGB 6.6 >> received 2 units.  Labs: CBGs: 147-215, Na 127, Mg 2.8, Cl 96  Diet Order:  Diet regular Room service appropriate?: Yes; Fluid consistency:: Thin  Skin:  Wound (see comment) (bruising, jaundice.)  Last BM:  1/24  Height:   Ht Readings from Last 1 Encounters:  09/20/15  (1.727 m)    Weight:   Wt Readings from Last 1 Encounters:  09/21/15 154 lb 5.2 oz (70 kg)    Ideal Body Weight:  70 kg  BMI:  Body mass index is 23.47 kg/(m^2).  Estimated Nutritional Needs:   Kcal:  1750-2100  Protein:  70-80 grams  Fluid:  1.2L  EDUCATION NEEDS:   No education  needs identified at this time  Dionne Ano. Benjerman Molinelli, MS, RD LDN After Hours/Weekend Pager [phone]

## 2015-09-21 NOTE — Progress Notes (Signed)
[facility] Progress Note   Clayton Watson  ZOX:[id number]  DOB: [birth year]  DOA: 09/20/2015 PCP: [name], MD  Brief narrative: Clayton Watson is a 55 y.o. male history of liver cirrhosis with ascites, previous history of alcoholism who had left AGAINST MEDICAL ADVICE 2 days ago after being admitted for anemia and ascites. He returns for shortness of breath and increasing ascites.    Subjective: No complaints of pain dyspnea, nausea vomiting. Discussed his low Hb and he tells me that he had black stools yesterday- still does not want and endoscopy and would like to see if he stops bleeding.   Assessment/Plan: Principal Problem:   Acute respiratory failure with hypoxia  - likely due to abdominal distension- improved after paracentesis- no cough or dyspnea.   Active Problems:   Anemia- due to iron deficiency and acute blood loss - transfused 2 U PRBC- h/o banding of esophageal varices in Jan, 2016.  - he does not want a GI work up- cont Ferrous sulfate, IV Protonix- adding Nadolol -  checked Iron levels which are low- will transfuse IV Iron today    Alcoholic cirrhosis of liver with ascites - Decompensated hepatic cirrhosis  - MELD 24 - paracentesis ordered- reveals SBP  -cont diuretics -w ill add low dose Nadolol to decrease splanchnic pressure in case his varices are what is bleeding - states he is being evaluated for a liver transplant at [facility]  DM 2 - mild- HbA1c 6.4- on Insulin sliding scale  SBP - cont Zosyn for now- f/u cultures  Leukocytosis - started on Zosyn for SBP  Hyponatremia  -due to diuretics- follow   Antibiotics: Anti-infectives    Start     Dose/Rate Route Frequency Ordered Stop   09/20/15 0600  piperacillin-tazobactam (ZOSYN) IVPB 3.375 g     3.375 g 12.5 mL/hr over 240 Minutes Intravenous 3 times per day 09/20/15 0558       Code Status:     Code Status Orders        Start     Ordered   09/20/15 0535  Full code   Continuous      09/20/15 0534    Code Status History    Date Active Date Inactive Code Status Order ID Comments User Context   09/17/2015  5:00 AM 09/18/2015  1:59 PM Full Code [id number]  Clydie Braun, MD ED   09/03/2014 11:38 PM 09/04/2014  2:50 PM Full Code [id number]  Pearson Grippe, MD Inpatient   08/03/2014  1:58 AM 08/04/2014  1:02 PM Full Code [id number]  [name], MD Inpatient   08/03/2014  1:04 AM 08/03/2014  1:58 AM Full Code [id number]  [name], MD ED   11/26/2011  5:29 PM 11/27/2011  1:44 PM DNR [id number]  Oretha Milch, MD Inpatient     Family Communication:  Disposition Plan: d/c home in 1-2 days DVT prophylaxis: SCD due to possible GI bleed Consultants:  Procedures:      Objective: Filed Weights   09/20/15 0037 09/20/15 0830 09/21/15 0606  Weight: 79.379 kg (175 lb) 83.3 kg (183 lb 10.3 oz) 70 kg (154 lb 5.2 oz)    Intake/Output Summary (Last 24 hours) at 09/21/15 1122 Last data filed at 09/21/15 1034  Gross per 24 hour  Intake   1273 ml  Output   4900 ml  Net  -3627 ml     Vitals Filed Vitals:   09/20/15 1856 09/21/15 0047 09/21/15 0606 09/21/15 0904  BP: 105/60 98/50 89/56  92/45  Pulse: 98 105 102 105  Temp: 97.7 F (36.5 C) 97.9 F (36.6 C) 98.4 F (36.9 C)   TempSrc: Oral Oral Oral   Resp: Height:      Weight:   70 kg (154 lb 5.2 oz)   SpO2: 100% 98% 99%     Exam:  General:  Pt is alert, not in acute distress  HEENT: No icterus, No thrush, oral mucosa moist  Cardiovascular: regular rate and rhythm, S1/S2 No murmur  Respiratory: clear to auscultation bilaterally   Abdomen: Soft, +Bowel sounds, non tender,  distended, no guarding  MSK: No cyanosis or clubbing- no pedal edema   Data Reviewed: Basic Metabolic Panel:  Recent Labs Lab 09/16/15 2235 09/17/15 0515 09/20/15 0158 09/20/15 1505 09/21/15 0612  NA 131* 131* 128* 130* 127*  K 4.0 3.7 4.8 3.9 3.7  CL 100* 100* 97* 99* 96*  CO2 21* 21* 17* 21* 21*  GLUCOSE 173* 121* 145* 179* 145*   BUN 10 11 25* 21* 21*  CREATININE 1.13 1.00 0.99 0.98 1.19  CALCIUM 8.4* 8.0* 8.9 8.7* 7.8*   Liver Function Tests:  Recent Labs Lab 09/16/15 2235 09/20/15 0158 09/20/15 1505 09/21/15 0612  AST 38 35 38 30  ALT ALKPHOS 118 90 89 83  BILITOT 1.2 1.6* 4.1* 2.3*  PROT 5.8* 5.5* 5.9* 5.3*  ALBUMIN 2.6* 2.7* 2.7* 2.3*    Recent Labs Lab 09/16/15 2235  LIPASE 57*    Recent Labs Lab 09/20/15 0158  AMMONIA 129*   CBC:  Recent Labs Lab 09/16/15 2235 09/17/15 0515 09/17/15 1247 09/20/15 0158 09/20/15 1505 09/21/15 0612  WBC 7.7 7.8  --  16.4* 16.8* 12.3*  NEUTROABS  --   --   --  13.4* 13.9*  --   HGB 6.7* 6.7* 6.2* 6.6* 8.0* 7.5*  HCT 22.1* 21.5* 20.7* 21.5* 24.7* 22.9*  MCV 82.8 82.7  --  79.9 80.7 81.2  PLT 136* 130*  --  190 125* 104*   Cardiac Enzymes: No results for input(s): CKTOTAL, CKMB, CKMBINDEX, TROPONINI in the last 168 hours. BNP (last 3 results) No results for input(s): BNP in the last 8760 hours.  ProBNP (last 3 results) No results for input(s): PROBNP in the last 8760 hours.  CBG:  Recent Labs Lab 09/20/15 1127 09/20/15 1721 09/21/15 0042 09/21/15 0651 09/21/15 0758  GLUCAP 171* 215* 202* 147* 149*    Recent Results (from the past 240 hour(s))  MRSA PCR Screening     Status: None   Collection Time: 09/20/15  8:49 AM  Result Value Ref Range Status   MRSA by PCR NEGATIVE NEGATIVE Final    Comment:        The GeneXpert MRSA Assay (FDA approved for NASAL specimens only), is one component of a comprehensive MRSA colonization surveillance program. It is not intended to diagnose MRSA infection nor to guide or monitor treatment for MRSA infections.   Gram stain     Status: None   Collection Time: 09/20/15  1:45 PM  Result Value Ref Range Status   Specimen Description FLUID ASCITIC  Final   Special Requests NONE  Final   Gram Stain   Final    ABUNDANT WBC PRESENT,BOTH PMN AND MONONUCLEAR NO ORGANISMS SEEN     Report Status 09/20/2015 FINAL  Final     Studies: Dg Chest 2 View  09/20/2015  CLINICAL DATA:  Shortness of breath.  Liver failure.  Ascites.  EXAM: CHEST  2 VIEW COMPARISON:  11/26/2011 FINDINGS: Shallow inspiration. Normal heart size and pulmonary vascularity. No focal airspace disease or consolidation in the lungs. No blunting of costophrenic angles. No pneumothorax. Mediastinal contours appear intact. Old right rib fractures. Calcified and tortuous aorta. IMPRESSION: No active cardiopulmonary disease. Electronically Signed   By: Burman Nieves M.D.   On: 09/20/2015 02:17   US Paracentesis  09/20/2015  INDICATION: Alcoholic cirrhosis with recurrent ascites. Request diagnostic and therapeutic paracentesis. EXAM: ULTRASOUND GUIDED LEFT LOWER QUADRANT PARACENTESIS MEDICATIONS: None. ANESTHESIA/SEDATION: Moderate Sedation Time:  None COMPLICATIONS: None immediate. PROCEDURE: Informed written consent was obtained from the patient after a discussion of the risks, benefits and alternatives to treatment. A timeout was performed prior to the initiation of the procedure. Initial ultrasound scanning demonstrates a large amount of ascites within the right lower abdominal quadrant. The right lower abdomen was prepped and draped in the usual sterile fashion. 1% lidocaine with epinephrine was used for local anesthesia. Under direct ultrasound guidance, a Safe-T-Centesis was introduced. An ultrasound image was saved for documentation purposes. The paracentesis was performed. The catheter was removed and a dressing was applied. The patient tolerated the procedure well without immediate post procedural complication. FINDINGS: A total of approximately 6.3 L of clear yellow fluid was removed. Samples were sent to the laboratory as requested by the clinical team. IMPRESSION: Successful ultrasound-guided paracentesis yielding 6.3 liters of peritoneal fluid. Read by: Brayton El PA-C Electronically Signed   By: Corlis Leak  M.D.   On: 09/20/2015 14:18    Scheduled Meds:  Scheduled Meds: . sodium chloride   Intravenous Once  . feeding supplement (ENSURE ENLIVE)  237 mL Oral BID BM  . ferrous sulfate  325 mg Oral Q breakfast  . furosemide  120 mg Oral Q24H   And  . furosemide  160 mg Oral q1800  . insulin aspart  0-15 Units Subcutaneous TID WC  . insulin aspart  0-5 Units Subcutaneous QHS  . multivitamin with minerals  1 tablet Oral Daily  . pantoprazole (PROTONIX) IV  40 mg Intravenous Q12H  . piperacillin-tazobactam (ZOSYN)  IV  3.375 g Intravenous 3 times per day  . potassium chloride SA  20 mEq Oral Daily  . sodium chloride  3 mL Intravenous Q12H  . spironolactone  200 mg Oral BID  . zolpidem  10 mg Oral QHS   Continuous Infusions:   Time spent on care of this patient: 35 min   Nyna Chilton, MD 09/21/2015, 11:22 AM  LOS: 1 day   [facility] Office  [phone] Pager - Text Page per [email or web address] If 7PM-7AM, please contact night-coverage [email or web address]

## 2015-09-21 NOTE — Evaluation (Signed)
Physical Therapy Evaluation Patient Details Name: Clayton Watson MRN: [id number] DOB: [birth year] Today's Date: 09/21/2015   History of Present Illness  Patient is a 55 y/o male with hx of alcoholism, cirrhosis, insomnia and ascitic fluid who had left AGAINST MEDICAL ADVICE 2 days ago (1/23) after being admitted for anemia and ascites. He returns for shortness of breath and increasing ascites. s/p paracentesis 1/24.  Clinical Impression  Patient presents with decreased balance and mobility especially during higher level balance challenges- slow walking, backwards walking and turning. Required Min A to prevent fall when walking backwards. Balance seems worse with challenges combined with distractions. Will follow acutely to maximize independence and mobility prior to return home. Anticipate pt's mobility and safety with improve with increased ambulation.     Follow Up Recommendations No PT follow up;Supervision - Intermittent    Equipment Recommendations  None recommended by PT    Recommendations for Other Services       Precautions / Restrictions Precautions Precautions: Fall Restrictions Weight Bearing Restrictions: No      Mobility  Bed Mobility Overal bed mobility: Modified Independent                Transfers Overall transfer level: Needs assistance Equipment used: None Transfers: Sit to/from Stand Sit to Stand: Min guard         General transfer comment: Mildly unsteady upon standing but no LOB.   Ambulation/Gait Ambulation/Gait assistance: Min assist Ambulation Distance (Feet): 350 Feet Assistive device: None Gait Pattern/deviations: Step-through pattern;Decreased stride length;Staggering left;Staggering right   Gait velocity interpretation: Below normal speed for age/gender General Gait Details: Steady gait with mild drifting without balance challenges. No DOE. Staggering and deviations in gait noted with higher level balance activities. See balance section  for details.   Stairs            Wheelchair Mobility    Modified Rankin (Stroke Patients Only)       Balance Overall balance assessment: Needs assistance Sitting-balance support: Feet supported;No upper extremity supported Sitting balance-Leahy Scale: Good Sitting balance - Comments: Able to donn socks in supine without difficulty.    Standing balance support: During functional activity Standing balance-Leahy Scale: Good               High level balance activites: Backward walking;Direction changes;Turns;Sudden stops;Head turns High Level Balance Comments: Tolerated above with mild deviations in gait and LOB with backwards walking esp when talking and distracted. Able to perform changes in gait speed, stepping over and around objects without LOB. LOB with very slow walking. Standardized Balance Assessment Standardized Balance Assessment : Dynamic Gait Index   Dynamic Gait Index Level Surface: Normal Change in Gait Speed: Mild Impairment Gait with Horizontal Head Turns: Normal Gait with Vertical Head Turns: Normal Gait and Pivot Turn: Mild Impairment Step Over Obstacle: Mild Impairment Step Around Obstacles: Normal       Pertinent Vitals/Pain Pain Assessment: No/denies pain    Home Living Family/patient expects to be discharged to:: Private residence Living Arrangements: Alone   Type of Home: House Home Access: Level entry     Home Layout: One level Home Equipment: None      Prior Function Level of Independence: Independent         Comments: works on cars; just purchased as new RV.     Hand Dominance        Extremity/Trunk Assessment   Upper Extremity Assessment: Defer to OT evaluation  Lower Extremity Assessment: Generalized weakness         Communication   Communication: No difficulties  Cognition Arousal/Alertness: Awake/alert Behavior During Therapy: WFL for tasks assessed/performed Overall Cognitive Status:  Within Functional Limits for tasks assessed                      General Comments      Exercises        Assessment/Plan    PT Assessment Patient needs continued PT services  PT Diagnosis Difficulty walking;Generalized weakness   PT Problem List Decreased strength;Decreased activity tolerance;Decreased balance;Decreased mobility  PT Treatment Interventions Balance training;Gait training;Functional mobility training;Therapeutic activities;Therapeutic exercise;Patient/family education   PT Goals (Current goals can be found in the Care Plan section) Acute Rehab PT Goals Patient Stated Goal: to drive my RV to [place] to see the love of my life PT Goal Formulation: With patient Time For Goal Achievement: 10/05/15 Potential to Achieve Goals: Good    Frequency Min 3X/week   Barriers to discharge Decreased caregiver support LIves alone    Co-evaluation               End of Session Equipment Utilized During Treatment: Gait belt Activity Tolerance: Patient tolerated treatment well Patient left: in bed;with call bell/phone within reach;with bed alarm set Nurse Communication: Mobility status         Time: 4332-9518 PT Time Calculation (min) (ACUTE ONLY): 19 min   Charges:   PT Evaluation $PT Eval Moderate Complexity: 1 Procedure     PT G Codes:        Clayton Watson 09/21/2015, 12:07 PM Clayton Watson, PT, DPT [phone]

## 2015-09-22 LAB — CBC
HCT: 24.5 % — ABNORMAL LOW (ref 39.0–52.0)
HCT: 26.6 % — ABNORMAL LOW (ref 39.0–52.0)
Hemoglobin: 7.8 g/dL — ABNORMAL LOW (ref 13.0–17.0)
Hemoglobin: 8 g/dL — ABNORMAL LOW (ref 13.0–17.0)
MCH: 25.9 pg — AB (ref 26.0–34.0)
MCH: 25.2 pg — AB (ref 26.0–34.0)
MCHC: 31.8 g/dL (ref 30.0–36.0)
MCHC: 30.1 g/dL (ref 30.0–36.0)
MCV: 81.4 fL (ref 78.0–100.0)
MCV: 83.9 fL (ref 78.0–100.0)
PLATELETS: 118 10*3/uL — AB (ref 150–400)
PLATELETS: 164 10*3/uL (ref 150–400)
RBC: 3.01 MIL/uL — ABNORMAL LOW (ref 4.22–5.81)
RBC: 3.17 MIL/uL — AB (ref 4.22–5.81)
RDW: 18.2 % — ABNORMAL HIGH (ref 11.5–15.5)
RDW: 18.2 % — ABNORMAL HIGH (ref 11.5–15.5)
WBC: 10 10*3/uL (ref 4.0–10.5)
WBC: 10.4 10*3/uL (ref 4.0–10.5)

## 2015-09-22 LAB — BASIC METABOLIC PANEL
Anion gap: 6 (ref 5–15)
BUN: 18 mg/dL (ref 6–20)
CO2: 22 mmol/L (ref 22–32)
CREATININE: 1.02 mg/dL (ref 0.61–1.24)
Calcium: 7.8 mg/dL — ABNORMAL LOW (ref 8.9–10.3)
Chloride: 99 mmol/L — ABNORMAL LOW (ref 101–111)
GFR calc Af Amer: >60 mL/min (ref >60)
Glucose, Bld: 112 mg/dL — ABNORMAL HIGH (ref 65–99)
Potassium: 3.2 mmol/L — ABNORMAL LOW (ref 3.5–5.1)
SODIUM: 127 mmol/L — AB (ref 135–145)

## 2015-09-22 LAB — GLUCOSE, CAPILLARY
GLUCOSE-CAPILLARY: 195 mg/dL — AB (ref 65–99)
Glucose-Capillary: 112 mg/dL — ABNORMAL HIGH (ref 65–99)
Glucose-Capillary: 176 mg/dL — ABNORMAL HIGH (ref 65–99)
Glucose-Capillary: 191 mg/dL — ABNORMAL HIGH (ref 65–99)

## 2015-09-22 MED ORDER — FERROUS SULFATE 325 (65 FE) MG PO TABS
325.0000 mg | ORAL_TABLET | Freq: Two times a day (BID) | ORAL | Status: DC
Start: 2015-09-22 — End: 2015-09-24
  Administered 2015-09-22 – 2015-09-24 (×4): 325 mg via ORAL
  Filled 2015-09-22 (×4): qty 1

## 2015-09-22 MED ORDER — POTASSIUM CHLORIDE CRYS ER 20 MEQ PO TBCR
40.0000 meq | EXTENDED_RELEASE_TABLET | Freq: Once | ORAL | Status: AC
  Administered 2015-09-22: 40 meq via ORAL
  Filled 2015-09-22: qty 2

## 2015-09-22 NOTE — Progress Notes (Signed)
[facility] Progress Note   [name]  ZOX:[id number]  DOB: Mar 01, 1961  DOA: 09/20/2015 PCP: [name], MD  Brief narrative: [name] is a 55 y.o. male history of liver cirrhosis with ascites, previous history of alcoholism who had left AGAINST MEDICAL ADVICE 2 days ago after being admitted for anemia and ascites. He returns for shortness of breath and increasing ascites.    Subjective: No complaints of pain dyspnea, nausea vomiting. Discussed his low Hb and he tells me that he had black stools yesterday- still does not want and endoscopy and would like to see if he stops bleeding.   Assessment/Plan: Principal Problem:   Acute respiratory failure with hypoxia  - likely due to abdominal distension- improved after paracentesis- no cough or dyspnea.   Active Problems:   Anemia- due to iron deficiency and acute blood loss - transfused 2 U PRBC- h/o banding of esophageal varices in Jan, 2016.  - he does not want a GI work up- cont Ferrous sulfate, IV Protonix- added Nadolol -  checked Iron levels which are low- will transfused IV Iron     Alcoholic cirrhosis of liver with ascites - Decompensated hepatic cirrhosis  - MELD 24 - paracentesis ordered- reveals SBP  -cont diuretics -will add low dose Nadolol to decrease splanchnic pressure in case his varices are what is bleeding - states he is being evaluated for a liver transplant at [facility]  DM 2 - mild- HbA1c 6.4- on Insulin sliding scale  SBP - cont Zosyn for now- f/u cultures  Leukocytosis - started on Zosyn for SBP  Hyponatremia  -due to diuretics- follow   Antibiotics: Anti-infectives    Start     Dose/Rate Route Frequency Ordered Stop   09/22/15 0030  piperacillin-tazobactam (ZOSYN) IVPB 3.375 g     3.375 g 12.5 mL/hr over 240 Minutes Intravenous Every 8 hours 09/21/15 2156     09/20/15 0600  piperacillin-tazobactam (ZOSYN) IVPB 3.375 g  Status:  Discontinued     3.375 g 12.5 mL/hr over 240 Minutes  Intravenous 3 times per day 09/20/15 0558 09/21/15 2156     Code Status:     Code Status Orders        Start     Ordered   09/20/15 0535  Full code   Continuous     09/20/15 0534    Code Status History    Date Active Date Inactive Code Status Order ID Comments User Context   09/17/2015  5:00 AM 09/18/2015  1:59 PM Full Code [id number]  Clydie Braun, MD ED   09/03/2014 11:38 PM 09/04/2014  2:50 PM Full Code [id number]  Pearson Grippe, MD Inpatient   08/03/2014  1:58 AM 08/04/2014  1:02 PM Full Code [id number]  [name], MD Inpatient   08/03/2014  1:04 AM 08/03/2014  1:58 AM Full Code [id number]  [name], MD ED   11/26/2011  5:29 PM 11/27/2011  1:44 PM DNR [id number]  Oretha Milch, MD Inpatient     Family Communication:  Disposition Plan: d/c home in 1-2 days DVT prophylaxis: SCD due to possible GI bleed Consultants:  Procedures:      Objective: Filed Weights   09/20/15 0830 09/21/15 0606 09/22/15 0516  Weight: 83.3 kg (183 lb 10.3 oz) 70 kg (154 lb 5.2 oz) 70.2 kg (154 lb 12.2 oz)    Intake/Output Summary (Last 24 hours) at 09/22/15 1129 Last data filed at 09/22/15 0521  Gross per 24 hour  Intake  490 ml  Output    500 ml  Net    -10 ml     Vitals Filed Vitals:   09/21/15 1815 09/22/15 0041 09/22/15 0516 09/22/15 0523  BP: 92/48 93/56  85/49  Pulse: 80 79  74  Temp: 97.8 F (36.6 C) 97.8 F (36.6 C)  97.7 F (36.5 C)  TempSrc: Oral Oral  Oral  Resp: 15 18    Height:      Weight:   70.2 kg (154 lb 12.2 oz)   SpO2: 100% 98%  98%    Exam:  General:  Pt is alert, not in acute distress  HEENT: No icterus, No thrush, oral mucosa moist  Cardiovascular: regular rate and rhythm, S1/S2 No murmur  Respiratory: clear to auscultation bilaterally   Abdomen: Soft, +Bowel sounds, non tender,  distended, no guarding  MSK: No cyanosis or clubbing- no pedal edema   Data Reviewed: Basic Metabolic Panel:  Recent Labs Lab 09/17/15 0515 09/20/15 0158 09/20/15 1505  09/21/15 0612 09/22/15 0645  NA 131* 128* 130* 127* 127*  K 3.7 4.8 3.9 3.7 3.2*  CL 100* 97* 99* 96* 99*  CO2 21* 17* 21* 21* 22  GLUCOSE 121* 145* 179* 145* 112*  BUN 11 25* 21* 21* 18  CREATININE 1.00 0.99 0.98 1.19 1.02  CALCIUM 8.0* 8.9 8.7* 7.8* 7.8*   Liver Function Tests:  Recent Labs Lab 09/16/15 2235 09/20/15 0158 09/20/15 1505 09/21/15 0612  AST 38 35 38 30  ALT ALKPHOS 118 90 89 83  BILITOT 1.2 1.6* 4.1* 2.3*  PROT 5.8* 5.5* 5.9* 5.3*  ALBUMIN 2.6* 2.7* 2.7* 2.3*    Recent Labs Lab 09/16/15 2235  LIPASE 57*    Recent Labs Lab 09/20/15 0158  AMMONIA 129*   CBC:  Recent Labs Lab 09/20/15 0158 09/20/15 1505 09/21/15 0612 09/21/15 1920 09/22/15 0645  WBC 16.4* 16.8* 12.3* 13.4* 10.0  NEUTROABS 13.4* 13.9*  --   --   --   HGB 6.6* 8.0* 7.5* 8.1* 7.8*  HCT 21.5* 24.7* 22.9* 25.2* 24.5*  MCV 79.9 80.7 81.2 81.6 81.4  PLT 190 125* 104* 137* 118*   Cardiac Enzymes: No results for input(s): CKTOTAL, CKMB, CKMBINDEX, TROPONINI in the last 168 hours. BNP (last 3 results) No results for input(s): BNP in the last 8760 hours.  ProBNP (last 3 results) No results for input(s): PROBNP in the last 8760 hours.  CBG:  Recent Labs Lab 09/21/15 0758 09/21/15 1154 09/21/15 1652 09/21/15 2245 09/22/15 0758  GLUCAP 149* 244* 200* 190* 112*    Recent Results (from the past 240 hour(s))  MRSA PCR Screening     Status: None   Collection Time: 09/20/15  8:49 AM  Result Value Ref Range Status   MRSA by PCR NEGATIVE NEGATIVE Final    Comment:        The GeneXpert MRSA Assay (FDA approved for NASAL specimens only), is one component of a comprehensive MRSA colonization surveillance program. It is not intended to diagnose MRSA infection nor to guide or monitor treatment for MRSA infections.   Culture, body fluid-bottle     Status: None (Preliminary result)   Collection Time: 09/20/15  1:45 PM  Result Value Ref Range Status   Specimen  Description FLUID ASCITIC  Final   Special Requests NONE  Final   Culture NO GROWTH < 24 HOURS  Final   Report Status PENDING  Incomplete  Gram stain     Status: None  Collection Time: 09/20/15  1:45 PM  Result Value Ref Range Status   Specimen Description FLUID ASCITIC  Final   Special Requests NONE  Final   Gram Stain   Final    ABUNDANT WBC PRESENT,BOTH PMN AND MONONUCLEAR NO ORGANISMS SEEN    Report Status 09/20/2015 FINAL  Final     Studies: US Paracentesis  09/20/2015  INDICATION: Alcoholic cirrhosis with recurrent ascites. Request diagnostic and therapeutic paracentesis. EXAM: ULTRASOUND GUIDED LEFT LOWER QUADRANT PARACENTESIS MEDICATIONS: None. ANESTHESIA/SEDATION: Moderate Sedation Time:  None COMPLICATIONS: None immediate. PROCEDURE: Informed written consent was obtained from the patient after a discussion of the risks, benefits and alternatives to treatment. A timeout was performed prior to the initiation of the procedure. Initial ultrasound scanning demonstrates a large amount of ascites within the right lower abdominal quadrant. The right lower abdomen was prepped and draped in the usual sterile fashion. 1% lidocaine with epinephrine was used for local anesthesia. Under direct ultrasound guidance, a Safe-T-Centesis was introduced. An ultrasound image was saved for documentation purposes. The paracentesis was performed. The catheter was removed and a dressing was applied. The patient tolerated the procedure well without immediate post procedural complication. FINDINGS: A total of approximately 6.3 L of clear yellow fluid was removed. Samples were sent to the laboratory as requested by the clinical team. IMPRESSION: Successful ultrasound-guided paracentesis yielding 6.3 liters of peritoneal fluid. Read by: Brayton El PA-C Electronically Signed   By: Corlis Leak M.D.   On: 09/20/2015 14:18    Scheduled Meds:  Scheduled Meds: . sodium chloride   Intravenous Once  . feeding  supplement (ENSURE ENLIVE)  237 mL Oral BID BM  . ferrous sulfate  325 mg Oral Q breakfast  . furosemide  120 mg Oral Q24H   And  . furosemide  160 mg Oral q1800  . insulin aspart  0-15 Units Subcutaneous TID WC  . insulin aspart  0-5 Units Subcutaneous QHS  . multivitamin with minerals  1 tablet Oral Daily  . nadolol  20 mg Oral Daily  . pantoprazole (PROTONIX) IV  40 mg Intravenous Q12H  . piperacillin-tazobactam (ZOSYN)  IV  3.375 g Intravenous Q8H  . potassium chloride SA  20 mEq Oral Daily  . potassium chloride  40 mEq Oral Once  . sodium chloride  3 mL Intravenous Q12H  . spironolactone  50 mg Oral BID  . zolpidem  10 mg Oral QHS   Continuous Infusions:   Time spent on care of this patient: 35 min   Nikalas Bramel, MD 09/22/2015, 11:29 AM  LOS: 2 days   [facility] Office  [phone] Pager - Text Page per [email or web address] If 7PM-7AM, please contact night-coverage [email or web address]

## 2015-09-22 NOTE — Progress Notes (Signed)
Inpatient Diabetes Program Recommendations  AACE/ADA: New Consensus Statement on Inpatient Glycemic Control (2015)  Target Ranges:  Prepandial:   less than 140 mg/dL      Peak postprandial:   less than 180 mg/dL (1-2 hours)      Critically ill patients:  140 - 180 mg/dL   Results for Clayton Watson, Clayton Watson (MRN [id number]) as of 09/22/2015 10:20  Ref. Range 09/21/2015 07:58 09/21/2015 11:54 09/21/2015 16:52 09/21/2015 22:45 09/22/2015 07:58  Glucose-Capillary Latest Ref Range: 65-99 mg/dL 409 (H) 811 (H) 914 (H) 190 (H) 112 (H)   Review of Glycemic Control  Current orders for Inpatient glycemic control: Novolog Moderate + HS scale  Patient A1c was 6.4% on 09/17/2015,  Prediabetes  Inpatient Diabetes Program Recommendations: Correction (SSI): Noted patient started on Novolog Moderate scale yesterday 1/25. Note patient on regular diet and is drinking Ensure. Glucose spiking into the 200's after Ensure, could possibly change Ensure to Glucerna to prevent glucose spike.  Thanks,  Christena Deem RN, MSN, [facility] Inpatient Diabetes Coordinator Team Pager [phone] (8a-5p)

## 2015-09-23 DIAGNOSIS — K652 Spontaneous bacterial peritonitis: Secondary | ICD-10-CM

## 2015-09-23 DIAGNOSIS — K922 Gastrointestinal hemorrhage, unspecified: Secondary | ICD-10-CM

## 2015-09-23 LAB — GLUCOSE, CAPILLARY
GLUCOSE-CAPILLARY: 176 mg/dL — AB (ref 65–99)
GLUCOSE-CAPILLARY: 126 mg/dL — AB (ref 65–99)

## 2015-09-23 MED ORDER — SPIRONOLACTONE 100 MG PO TABS: 200.0000 mg | ORAL_TABLET | Freq: Two times a day (BID) | ORAL | Status: AC

## 2015-09-23 MED ORDER — PANTOPRAZOLE SODIUM 40 MG PO TBEC
40.0000 mg | DELAYED_RELEASE_TABLET | Freq: Two times a day (BID) | ORAL | Status: DC
  Administered 2015-09-23 – 2015-09-24 (×3): 40 mg via ORAL
  Filled 2015-09-23 (×3): qty 1

## 2015-09-23 MED ORDER — FUROSEMIDE 40 MG PO TABS: 120.0000 mg | ORAL_TABLET | Freq: Two times a day (BID) | ORAL | Status: AC

## 2015-09-23 MED ORDER — CIPROFLOXACIN HCL 500 MG PO TABS: 500.0000 mg | ORAL_TABLET | Freq: Two times a day (BID) | ORAL | Status: DC

## 2015-09-23 MED ORDER — SPIRONOLACTONE 25 MG PO TABS
200.0000 mg | ORAL_TABLET | Freq: Two times a day (BID) | ORAL | Status: DC
  Administered 2015-09-23 – 2015-09-24 (×2): 200 mg via ORAL
  Filled 2015-09-23 (×2): qty 8

## 2015-09-23 MED ORDER — FERROUS SULFATE 325 (65 FE) MG PO TABS: 325.0000 mg | ORAL_TABLET | Freq: Two times a day (BID) | ORAL | Status: AC

## 2015-09-23 MED ORDER — CIPROFLOXACIN HCL 500 MG PO TABS
500.0000 mg | ORAL_TABLET | Freq: Two times a day (BID) | ORAL | Status: DC
  Administered 2015-09-23 – 2015-09-24 (×2): 500 mg via ORAL
  Filled 2015-09-23 (×2): qty 1

## 2015-09-23 MED ORDER — PANTOPRAZOLE SODIUM 40 MG PO TBEC: 40.0000 mg | DELAYED_RELEASE_TABLET | Freq: Every day | ORAL | Status: DC

## 2015-09-23 MED ORDER — NADOLOL 20 MG PO TABS: 10.0000 mg | ORAL_TABLET | Freq: Every day | ORAL | Status: AC

## 2015-09-23 NOTE — Progress Notes (Addendum)
Patient's IV infiltrated, RN removed IV. Catheter was clean, dry, and intact on removal. Paged IV team to restart IV. Pt refused to have IV restarted. MD notified. No new orders given at this time. Paged on-call MD for a second time about pt not having an IV and continuing to have scheduled IV abx tonight. MD returned page stating to leave out IV at this time because it is the pt's decision.

## 2015-09-23 NOTE — Progress Notes (Signed)
UR COMPLETED  

## 2015-09-23 NOTE — Discharge Summary (Addendum)
Physician Discharge Summary  Clayton Watson ZOX:[id number] DOB: 1961-05-07 DOA: 09/20/2015  PCP: [name], MD  Admit date: 09/20/2015 Discharge date: 09/24/2015  Time spent: 50 minutes   Patient refusing blood draw this AM  - Refused to have vitals done this AM but after convincing, he agreed. He has been hyperglycemic and was explained to need to have fingerstick and Insulin but refused this as well.  He had melena on admission but refused an EGD. Transitioned to oral antibiotic yesterday for SBP as his IV infiltrate and and refused to have it put back in.   My experience has been that he likes to dictate his care and unfortunately, it takes much convincing to help him understand the appropriate course of treatment.   Very Poor Prognosis if he does not have a liver transplant soon. High risk of recurrence of GI bleed and SBP.   Addendum 1:25PM:  RN called me feeling exasperated because during her discharge med review, the patient is being difficult and disagreeing with d/c medications. On the days I initiated Nadolol and Protonix, I explained the need for them (especially due the fact that he did not want an endoscopy) and he agreed to take them.  Explained again today on speaker phone, in detail, in presence of 2 RNs the reasoning behind his new medications including Nadalol, Protonix and Cipro. He goes on the state he will need to run them by his hepatologist prior to taking them to ensure they are appropriate for him. As mentioned, he is at high risk for readmission.  Discharge Condition:  stable    Discharge Diagnoses:  Principal Problem:   Acute respiratory failure with hypoxia (HCC) Active Problems:   SBP (spontaneous bacterial peritonitis) (HCC)   Melena   Anemia   Alcoholic cirrhosis of liver with ascites (HCC)   Decompensated hepatic cirrhosis (HCC)   Symptomatic anemia   History of present illness:  Clayton Watson is a 55 y.o. male history of liver cirrhosis with  ascites, previous history of alcoholism who had left AGAINST MEDICAL ADVICE 2 days ago after being admitted for anemia and ascites. He returns for shortness of breath and increasing ascites.   Hospital Course:  Principal Problem:  Acute respiratory failure with hypoxia  - likely due to abdominal distension- improved after paracentesis- no cough or dyspnea.   Active Problems:  Anemia- due to iron deficiency and acute blood loss - bleeding stopped after 2 days of IV Protonix- cont Protonix BID -checked Iron levels which - has low Ferretin, therefore transfused IV Iron   - increased oral Iron supplement to BID - transfused 2 U PRBC- Hb steady at 8 since then - h/o banding of esophageal varices in Jan, 2016- added Nadolol in case he has recurrence of Varices   - he does not want a GI work up and is refusing an endoscopy  SBP - in setting of upper GI bleed  - ascitic fluid WBC 1502 with 90% neutrophils and severe abdominal tenderness on initial exam- tenderness resolved quickly once antibiotics initiated -has been on Zosyn - cultures negative so far- as he refused to replace his IV, we changed him to Cipro oral on 1/27 - ordered f/u CBC for leukocytosis- patient refused- at this point I am recommend 10 more days of oral Cipro- patient advised if abdominal pain recurs, this could be a sigh of recurrence of infection.    Alcoholic cirrhosis of liver with ascites - Decompensated hepatic cirrhosis  - MELD 24 -cont diuretics -  added low dose Nadolol to decrease splanchnic pressure in case he has varices - states he is being evaluated for a liver transplant at [facility]  DM 2 - mild- HbA1c 6.4-sugars > 200 at times-  refused insulin sliding scale and fingersticks  Hyponatremia  -due to diuretics   Procedures:  paracentesis  Consultations:  none  Discharge Exam: Filed Weights   09/21/15 0606 09/22/15 0516 09/23/15 0532  Weight: 70 kg (154 lb 5.2 oz) 70.2 kg (154 lb 12.2 oz) 71.5 kg  (157 lb 10.1 oz)   Filed Vitals:   09/23/15 2301 09/24/15 1153  BP: 115/68 106/64  Pulse: 107 105  Temp: 98.5 F (36.9 C) 98.2 F (36.8 C)  Resp: 20 19    General: AAO x 3, no distress Cardiovascular: RRR, no murmurs  Respiratory: clear to auscultation bilaterally GI: soft, non-tender, non-distended, bowel sound positive  Discharge Instructions You were cared for by a hospitalist during your hospital stay. If you have any questions about your discharge medications or the care you received while you were in the hospital after you are discharged, you can call the unit and asked to speak with the hospitalist on call if the hospitalist that took care of you is not available. Once you are discharged, your primary care physician will handle any further medical issues. Please note that NO REFILLS for any discharge medications will be authorized once you are discharged, as it is imperative that you return to your primary care physician (or establish a relationship with a primary care physician if you do not have one) for your aftercare needs so that they can reassess your need for medications and monitor your lab values.      Discharge Instructions    Diet - low sodium heart healthy    Complete by:  As directed      Discharge instructions    Complete by:  As directed   See your family doctor in 1 wk.     Discharge instructions    Complete by:  As directed   Return to the hospital for blood in stool     Increase activity slowly    Complete by:  As directed             Medication List    TAKE these medications        ciprofloxacin 500 MG tablet  Commonly known as:  CIPRO  Take 1 tablet (500 mg total) by mouth 2 (two) times daily.     ferrous sulfate 325 (65 FE) MG tablet  Take 1 tablet (325 mg total) by mouth 2 (two) times daily with a meal.     furosemide 40 MG tablet  Commonly known as:  LASIX  Take 3-4 tablets (120-160 mg total) by mouth 2 (two) times daily. Take  in  the morning and  in the evening.     multivitamin with minerals Tabs tablet  Take 1 tablet by mouth daily.     nadolol 20 MG tablet  Commonly known as:  CORGARD  Take 0.5 tablets (10 mg total) by mouth daily.     oxycodone 5 MG capsule  Commonly known as:  OXY-IR  Take 5 mg by mouth every 4 (four) hours as needed for pain.     pantoprazole 40 MG tablet  Commonly known as:  PROTONIX  Take 1 tablet (40 mg total) by mouth 2 (two) times daily.     potassium chloride SA 20 MEQ tablet  Commonly known as:  K-DUR,KLOR-CON  Take 60 mEq by mouth daily.     spironolactone 100 MG tablet  Commonly known as:  ALDACTONE  Take 2 tablets (200 mg total) by mouth 2 (two) times daily.     zolpidem 10 MG tablet  Commonly known as:  AMBIEN  Take 1 tablet (10 mg total) by mouth at bedtime.       Allergies  Allergen Reactions  . Nsaids     Bleeding due to ulcer       The results of significant diagnostics from this hospitalization (including imaging, microbiology, ancillary and laboratory) are listed below for reference.    Significant Diagnostic Studies: Dg Chest 2 View  09/20/2015  CLINICAL DATA:  Shortness of breath.  Liver failure.  Ascites. EXAM: CHEST  2 VIEW COMPARISON:  11/26/2011 FINDINGS: Shallow inspiration. Normal heart size and pulmonary vascularity. No focal airspace disease or consolidation in the lungs. No blunting of costophrenic angles. No pneumothorax. Mediastinal contours appear intact. Old right rib fractures. Calcified and tortuous aorta. IMPRESSION: No active cardiopulmonary disease. Electronically Signed   By: Burman Nieves M.D.   On: 09/20/2015 02:17   US Paracentesis  09/20/2015  INDICATION: Alcoholic cirrhosis with recurrent ascites. Request diagnostic and therapeutic paracentesis. EXAM: ULTRASOUND GUIDED LEFT LOWER QUADRANT PARACENTESIS MEDICATIONS: None. ANESTHESIA/SEDATION: Moderate Sedation Time:  None COMPLICATIONS: None immediate. PROCEDURE: Informed  written consent was obtained from the patient after a discussion of the risks, benefits and alternatives to treatment. A timeout was performed prior to the initiation of the procedure. Initial ultrasound scanning demonstrates a large amount of ascites within the right lower abdominal quadrant. The right lower abdomen was prepped and draped in the usual sterile fashion. 1% lidocaine with epinephrine was used for local anesthesia. Under direct ultrasound guidance, a Safe-T-Centesis was introduced. An ultrasound image was saved for documentation purposes. The paracentesis was performed. The catheter was removed and a dressing was applied. The patient tolerated the procedure well without immediate post procedural complication. FINDINGS: A total of approximately 6.3 L of clear yellow fluid was removed. Samples were sent to the laboratory as requested by the clinical team. IMPRESSION: Successful ultrasound-guided paracentesis yielding 6.3 liters of peritoneal fluid. Read by: Brayton El PA-C Electronically Signed   By: Corlis Leak M.D.   On: 09/20/2015 14:18   US Paracentesis  09/17/2015  INDICATION: Cirrhosis, recurrent ascites. Request is made for therapeutic paracentesis. EXAM: ULTRASOUND-GUIDED THERAPEUTIC PARACENTESIS COMPARISON:  Prior paracentesis on 09/04/2014 MEDICATIONS: None. COMPLICATIONS: None immediate TECHNIQUE: Informed written consent was obtained from the patient after a discussion of the risks, benefits and alternatives to treatment. A timeout was performed prior to the initiation of the procedure. Initial ultrasound scanning demonstrates a large amount of ascites within the right lower abdominal quadrant. The right lower abdomen was prepped and draped in the usual sterile fashion. 1% lidocaine was used for local anesthesia. Under direct ultrasound guidance, a 19 gauge, 7-cm, Yueh catheter was introduced. An ultrasound image was saved for documentation purposed. The paracentesis was performed. The  catheter was removed and a dressing was applied. The patient tolerated the procedure well without immediate post procedural complication. FINDINGS: A total of approximately 8.7 liters of hazy, light yellow fluid was removed. IMPRESSION: Successful ultrasound-guided therapeutic paracentesis yielding 8.7 liters of peritoneal fluid. Read by: Jeananne Rama, PA-C Electronically Signed   By: Irish Lack M.D.   On: 09/17/2015 10:51    Microbiology: Recent Results (from the past 240 hour(s))  MRSA PCR Screening     Status:  None   Collection Time: 09/20/15  8:49 AM  Result Value Ref Range Status   MRSA by PCR NEGATIVE NEGATIVE Final    Comment:        The GeneXpert MRSA Assay (FDA approved for NASAL specimens only), is one component of a comprehensive MRSA colonization surveillance program. It is not intended to diagnose MRSA infection nor to guide or monitor treatment for MRSA infections.   Culture, body fluid-bottle     Status: None (Preliminary result)   Collection Time: 09/20/15  1:45 PM  Result Value Ref Range Status   Specimen Description FLUID ASCITIC  Final   Special Requests NONE  Final   Culture NO GROWTH 4 DAYS  Final   Report Status PENDING  Incomplete  Gram stain     Status: None   Collection Time: 09/20/15  1:45 PM  Result Value Ref Range Status   Specimen Description FLUID ASCITIC  Final   Special Requests NONE  Final   Gram Stain   Final    ABUNDANT WBC PRESENT,BOTH PMN AND MONONUCLEAR NO ORGANISMS SEEN    Report Status 09/20/2015 FINAL  Final     Labs: Basic Metabolic Panel:  Recent Labs Lab 09/20/15 0158 09/20/15 1505 09/21/15 0612 09/22/15 0645  NA 128* 130* 127* 127*  K 4.8 3.9 3.7 3.2*  CL 97* 99* 96* 99*  CO2 17* 21* 21* 22  GLUCOSE 145* 179* 145* 112*  BUN 25* 21* 21* 18  CREATININE 0.99 0.98 1.19 1.02  CALCIUM 8.9 8.7* 7.8* 7.8*   Liver Function Tests:  Recent Labs Lab 09/20/15 0158 09/20/15 1505 09/21/15 0612  AST 35 38 30  ALT ALKPHOS 90 89 83  BILITOT 1.6* 4.1* 2.3*  PROT 5.5* 5.9* 5.3*  ALBUMIN 2.7* 2.7* 2.3*   No results for input(s): LIPASE, AMYLASE in the last 168 hours.  Recent Labs Lab 09/20/15 0158  AMMONIA 129*   CBC:  Recent Labs Lab 09/20/15 0158 09/20/15 1505 09/21/15 0612 09/21/15 1920 09/22/15 0645 09/22/15 1904  WBC 16.4* 16.8* 12.3* 13.4* 10.0 10.4  NEUTROABS 13.4* 13.9*  --   --   --   --   HGB 6.6* 8.0* 7.5* 8.1* 7.8* 8.0*  HCT 21.5* 24.7* 22.9* 25.2* 24.5* 26.6*  MCV 79.9 80.7 81.2 81.6 81.4 83.9  PLT 190 125* 104* 137* 118* 164   Cardiac Enzymes: No results for input(s): CKTOTAL, CKMB, CKMBINDEX, TROPONINI in the last 168 hours. BNP: BNP (last 3 results) No results for input(s): BNP in the last 8760 hours.  ProBNP (last 3 results) No results for input(s): PROBNP in the last 8760 hours.  CBG:  Recent Labs Lab 09/22/15 1147 09/22/15 1717 09/22/15 2123 09/23/15 0747 09/23/15 1155  GLUCAP 176* 191* 195* 176* 126*       SignedCalvert Cantor, MD [facility] 09/24/2015, 12:37 PM

## 2015-09-23 NOTE — Progress Notes (Signed)
[facility] Progress Note   Clayton Watson  ZOX:[id number]  DOB: [birth year]  DOA: 09/20/2015 PCP: [name], MD  Brief narrative: Clayton Watson is a 55 y.o. male history of liver cirrhosis with ascites, previous history of alcoholism who had left AGAINST MEDICAL ADVICE 2 days ago after being admitted for anemia and ascites. He returns for shortness of breath and increasing ascites.    Subjective: IV infiltrated last night and he does not want a new one. No new complains of pain , nausea, vomiting or diarrhea.   Assessment/Plan: Principal Problem:   Acute respiratory failure with hypoxia  - likely due to abdominal distension- improved after paracentesis- no cough or dyspnea.   Active Problems:   Anemia- due to iron deficiency and acute blood loss - transfused 2 U PRBC- h/o banding of esophageal varices in Jan, 2016.  - he does not want a GI work up- cont Ferrous sulfate, IV Protonix- added Nadolol -  checked Iron levels which are low-   transfused IV Iron     Alcoholic cirrhosis of liver with ascites - Decompensated hepatic cirrhosis  - MELD 24 - paracentesis ordered- reveals SBP  -cont diuretics -will add low dose Nadolol to decrease splanchnic pressure in case his varices are what is bleeding - states he is being evaluated for a liver transplant at [facility]  DM 2 - mild- HbA1c 6.4- on Insulin sliding scale  SBP -hs been on Zosyn for now- cultures negative so far- as he has no IV, change to Cipro oral today  Leukocytosis - started on Zosyn for SBP  Hyponatremia  -due to diuretics- follow   Antibiotics: Anti-infectives    Start     Dose/Rate Route Frequency Ordered Stop   09/23/15 1245  ciprofloxacin (CIPRO) tablet 500 mg  Status:  Discontinued     500 mg Oral 2 times daily 09/23/15 1242 09/23/15 1250   09/23/15 1245  ciprofloxacin (CIPRO) tablet 500 mg     500 mg Oral 2 times daily 09/23/15 1243     09/23/15 0000  ciprofloxacin (CIPRO) 500 MG tablet     500  mg Oral 2 times daily 09/23/15 1039     09/22/15 0030  piperacillin-tazobactam (ZOSYN) IVPB 3.375 g  Status:  Discontinued     3.375 g 12.5 mL/hr over 240 Minutes Intravenous Every 8 hours 09/21/15 2156 09/23/15 1243   09/20/15 0600  piperacillin-tazobactam (ZOSYN) IVPB 3.375 g  Status:  Discontinued     3.375 g 12.5 mL/hr over 240 Minutes Intravenous 3 times per day 09/20/15 0558 09/21/15 2156     Code Status:     Code Status Orders        Start     Ordered   09/20/15 0535  Full code   Continuous     09/20/15 0534    Code Status History    Date Active Date Inactive Code Status Order ID Comments User Context   09/17/2015  5:00 AM 09/18/2015  1:59 PM Full Code [id number]  Clydie Braun, MD ED   09/03/2014 11:38 PM 09/04/2014  2:50 PM Full Code [id number]  Pearson Grippe, MD Inpatient   08/03/2014  1:58 AM 08/04/2014  1:02 PM Full Code [id number]  [name], MD Inpatient   08/03/2014  1:04 AM 08/03/2014  1:58 AM Full Code [id number]  [name], MD ED   11/26/2011  5:29 PM 11/27/2011  1:44 PM DNR [id number]  Oretha Milch, MD Inpatient     Family Communication:  Disposition  Plan: d/c home in 1-2 days DVT prophylaxis: SCD due to possible GI bleed Consultants:  Procedures:      Objective: Filed Weights   09/21/15 0606 09/22/15 0516 09/23/15 0532  Weight: 70 kg (154 lb 5.2 oz) 70.2 kg (154 lb 12.2 oz) 71.5 kg (157 lb 10.1 oz)    Intake/Output Summary (Last 24 hours) at 09/23/15 1621 Last data filed at 09/23/15 1513  Gross per 24 hour  Intake    723 ml  Output    250 ml  Net    473 ml     Vitals Filed Vitals:   09/22/15 1451 09/22/15 2125 09/23/15 0532 09/23/15 1421  BP: 104/61 104/63 91/47 105/61  Pulse: 90 102 106 106  Temp: 98.2 F (36.8 C) 98.4 F (36.9 C) 99 F (37.2 C) 98.1 F (36.7 C)  TempSrc: Oral Oral Oral Oral  Resp: Height:      Weight:   71.5 kg (157 lb 10.1 oz)   SpO2: 99% 100% 97% 100%    Exam:  General:  Pt is alert, not in acute  distress  HEENT: No icterus, No thrush, oral mucosa moist  Cardiovascular: regular rate and rhythm, S1/S2 No murmur  Respiratory: clear to auscultation bilaterally   Abdomen: Soft, +Bowel sounds, non tender,  distended, no guarding  MSK: No cyanosis or clubbing- no pedal edema   Data Reviewed: Basic Metabolic Panel:  Recent Labs Lab 09/17/15 0515 09/20/15 0158 09/20/15 1505 09/21/15 0612 09/22/15 0645  NA 131* 128* 130* 127* 127*  K 3.7 4.8 3.9 3.7 3.2*  CL 100* 97* 99* 96* 99*  CO2 21* 17* 21* 21* 22  GLUCOSE 121* 145* 179* 145* 112*  BUN 11 25* 21* 21* 18  CREATININE 1.00 0.99 0.98 1.19 1.02  CALCIUM 8.0* 8.9 8.7* 7.8* 7.8*   Liver Function Tests:  Recent Labs Lab 09/16/15 2235 09/20/15 0158 09/20/15 1505 09/21/15 0612  AST 38 35 38 30  ALT ALKPHOS 118 90 89 83  BILITOT 1.2 1.6* 4.1* 2.3*  PROT 5.8* 5.5* 5.9* 5.3*  ALBUMIN 2.6* 2.7* 2.7* 2.3*    Recent Labs Lab 09/16/15 2235  LIPASE 57*    Recent Labs Lab 09/20/15 0158  AMMONIA 129*   CBC:  Recent Labs Lab 09/20/15 0158 09/20/15 1505 09/21/15 0612 09/21/15 1920 09/22/15 0645 09/22/15 1904  WBC 16.4* 16.8* 12.3* 13.4* 10.0 10.4  NEUTROABS 13.4* 13.9*  --   --   --   --   HGB 6.6* 8.0* 7.5* 8.1* 7.8* 8.0*  HCT 21.5* 24.7* 22.9* 25.2* 24.5* 26.6*  MCV 79.9 80.7 81.2 81.6 81.4 83.9  PLT 190 125* 104* 137* 118* 164   Cardiac Enzymes: No results for input(s): CKTOTAL, CKMB, CKMBINDEX, TROPONINI in the last 168 hours. BNP (last 3 results) No results for input(s): BNP in the last 8760 hours.  ProBNP (last 3 results) No results for input(s): PROBNP in the last 8760 hours.  CBG:  Recent Labs Lab 09/22/15 1147 09/22/15 1717 09/22/15 2123 09/23/15 0747 09/23/15 1155  GLUCAP 176* 191* 195* 176* 126*    Recent Results (from the past 240 hour(s))  MRSA PCR Screening     Status: None   Collection Time: 09/20/15  8:49 AM  Result Value Ref Range Status   MRSA by PCR  NEGATIVE NEGATIVE Final    Comment:        The GeneXpert MRSA Assay (FDA approved for NASAL specimens only), is one  component of a comprehensive MRSA colonization surveillance program. It is not intended to diagnose MRSA infection nor to guide or monitor treatment for MRSA infections.   Culture, body fluid-bottle     Status: None (Preliminary result)   Collection Time: 09/20/15  1:45 PM  Result Value Ref Range Status   Specimen Description FLUID ASCITIC  Final   Special Requests NONE  Final   Culture NO GROWTH 3 DAYS  Final   Report Status PENDING  Incomplete  Gram stain     Status: None   Collection Time: 09/20/15  1:45 PM  Result Value Ref Range Status   Specimen Description FLUID ASCITIC  Final   Special Requests NONE  Final   Gram Stain   Final    ABUNDANT WBC PRESENT,BOTH PMN AND MONONUCLEAR NO ORGANISMS SEEN    Report Status 09/20/2015 FINAL  Final     Studies: No results found.  Scheduled Meds:  Scheduled Meds: . ciprofloxacin  500 mg Oral BID  . feeding supplement (ENSURE ENLIVE)  237 mL Oral BID BM  . ferrous sulfate  325 mg Oral BID WC  . furosemide  120 mg Oral Q24H   And  . furosemide  160 mg Oral q1800  . multivitamin with minerals  1 tablet Oral Daily  . nadolol  20 mg Oral Daily  . pantoprazole  40 mg Oral BID  . potassium chloride SA  20 mEq Oral Daily  . sodium chloride  3 mL Intravenous Q12H  . spironolactone  200 mg Oral BID  . zolpidem  10 mg Oral QHS   Continuous Infusions:   Time spent on care of this patient: 35 min   Amilah Greenspan, MD 09/23/2015, 4:21 PM  LOS: 3 days   [facility] Office  [phone] Pager - Text Page per [email or web address] If 7PM-7AM, please contact night-coverage [email or web address]

## 2015-09-23 NOTE — Care Management Important Message (Signed)
Important Message  Patient Details  Name: Clayton Watson MRN: [id number] Date of Birth: 03/11/1961   Medicare Important Message Given:  Yes    Clayton Watson 09/23/2015, 10:56 AM

## 2015-09-24 DIAGNOSIS — K921 Melena: Secondary | ICD-10-CM

## 2015-09-24 MED ORDER — CIPROFLOXACIN HCL 500 MG PO TABS: 500.0000 mg | ORAL_TABLET | Freq: Two times a day (BID) | ORAL | Status: DC

## 2015-09-24 MED ORDER — PANTOPRAZOLE SODIUM 40 MG PO TBEC: 40.0000 mg | DELAYED_RELEASE_TABLET | Freq: Two times a day (BID) | ORAL | Status: DC

## 2015-09-24 NOTE — Progress Notes (Signed)
09/24/15 Patient to be discharged home today, no IV site noted, discharge reviewed with patient.

## 2015-09-25 LAB — CULTURE, BODY FLUID W GRAM STAIN -BOTTLE: Culture: NO GROWTH

## 2015-09-25 LAB — CULTURE, BODY FLUID-BOTTLE

## 2015-10-14 ENCOUNTER — Encounter (HOSPITAL_COMMUNITY): Payer: Self-pay | Admitting: Family Medicine

## 2015-10-14 ENCOUNTER — Observation Stay (HOSPITAL_COMMUNITY): Payer: Medicare Other

## 2015-10-14 ENCOUNTER — Observation Stay (HOSPITAL_COMMUNITY)
Admission: EM | Admit: 2015-10-14 | Discharge: 2015-10-14 | Disposition: A | Discharge status: Home / Self Care | Payer: Medicare Other | Attending: Emergency Medicine | Admitting: Emergency Medicine

## 2015-10-14 DIAGNOSIS — D649 Anemia, unspecified: Secondary | ICD-10-CM | POA: Diagnosis present

## 2015-10-14 DIAGNOSIS — D500 Iron deficiency anemia secondary to blood loss (chronic): Secondary | ICD-10-CM | POA: Insufficient documentation

## 2015-10-14 DIAGNOSIS — D696 Thrombocytopenia, unspecified: Secondary | ICD-10-CM | POA: Diagnosis present

## 2015-10-14 DIAGNOSIS — K7031 Alcoholic cirrhosis of liver with ascites: Principal | ICD-10-CM | POA: Insufficient documentation

## 2015-10-14 DIAGNOSIS — R188 Other ascites: Secondary | ICD-10-CM | POA: Diagnosis present

## 2015-10-14 DIAGNOSIS — Z8719 Personal history of other diseases of the digestive system: Secondary | ICD-10-CM | POA: Diagnosis not present

## 2015-10-14 DIAGNOSIS — Z87891 Personal history of nicotine dependence: Secondary | ICD-10-CM | POA: Insufficient documentation

## 2015-10-14 DIAGNOSIS — I81 Portal vein thrombosis: Secondary | ICD-10-CM | POA: Diagnosis not present

## 2015-10-14 LAB — COMPREHENSIVE METABOLIC PANEL
ALBUMIN: 2.5 g/dL — AB (ref 3.5–5.0)
ALK PHOS: 85 U/L (ref 38–126)
ALT: 20 U/L (ref 17–63)
AST: 27 U/L (ref 15–41)
Anion gap: 11 (ref 5–15)
BUN: 14 mg/dL (ref 6–20)
CALCIUM: 8.2 mg/dL — AB (ref 8.9–10.3)
CHLORIDE: 98 mmol/L — AB (ref 101–111)
CO2: 18 mmol/L — AB (ref 22–32)
CREATININE: 0.87 mg/dL (ref 0.61–1.24)
GFR calc non Af Amer: >60 mL/min (ref >60)
GLUCOSE: 158 mg/dL — AB (ref 65–99)
Potassium: 4 mmol/L (ref 3.5–5.1)
SODIUM: 127 mmol/L — AB (ref 135–145)
Total Bilirubin: 1.5 mg/dL — ABNORMAL HIGH (ref 0.3–1.2)
Total Protein: 5.8 g/dL — ABNORMAL LOW (ref 6.5–8.1)

## 2015-10-14 LAB — CBC
HCT: 25.7 % — ABNORMAL LOW (ref 39.0–52.0)
Hemoglobin: 7.6 g/dL — ABNORMAL LOW (ref 13.0–17.0)
MCH: 25 pg — AB (ref 26.0–34.0)
MCHC: 29.6 g/dL — AB (ref 30.0–36.0)
MCV: 84.5 fL (ref 78.0–100.0)
PLATELETS: 161 10*3/uL (ref 150–400)
RBC: 3.04 MIL/uL — AB (ref 4.22–5.81)
RDW: 19.2 % — ABNORMAL HIGH (ref 11.5–15.5)
WBC: 11.6 10*3/uL — ABNORMAL HIGH (ref 4.0–10.5)

## 2015-10-14 LAB — PROTIME-INR
INR: 1.54 — ABNORMAL HIGH (ref 0.00–1.49)
PROTHROMBIN TIME: 18.5 s — AB (ref 11.6–15.2)

## 2015-10-14 LAB — LIPASE, BLOOD: LIPASE: 71 U/L — AB (ref 11–51)

## 2015-10-14 LAB — POC OCCULT BLOOD, ED: FECAL OCCULT BLD: POSITIVE — AB

## 2015-10-14 LAB — APTT: aPTT: 30 seconds (ref 24–37)

## 2015-10-14 MED ORDER — ALBUMIN HUMAN 25 % IV SOLN
12.5000 g | Freq: Once | INTRAVENOUS | Status: AC
  Administered 2015-10-14: 12.5 g via INTRAVENOUS
  Filled 2015-10-14: qty 50

## 2015-10-14 MED ORDER — LIDOCAINE HCL (PF) 1 % IJ SOLN
INTRAMUSCULAR | Status: AC
  Filled 2015-10-14: qty 10

## 2015-10-14 NOTE — ED Notes (Signed)
Pt here with sig swelling to abdomen. sts 30 pounds or more. Hx of the same. sts x a couple of days.

## 2015-10-14 NOTE — ED Notes (Signed)
Albumin started, [place] called to coordinate approximate Paracentesis time.

## 2015-10-14 NOTE — Progress Notes (Signed)
Patient's vital signs have remained stable with systolic blood pressures greater than 90 systolic since paracentesis. 8 L of turbid light yellow fluid removed. Patient eager to be discharged home and plans to obtain diet after discharge. Please see discharge orders in AVS.  Junious Silk, ANP

## 2015-10-14 NOTE — ED Provider Notes (Signed)
CSN: [id number]     Arrival date & time 10/14/15  0813 History   First MD Initiated Contact with Patient 10/14/15 0912     Chief Complaint  Patient presents with  . Ascites     (Consider location/radiation/quality/duration/timing/severity/associated sxs/prior Treatment) Patient is a 55 y.o. male presenting with abdominal pain. The history is provided by the patient.  Abdominal Pain Pain location:  Generalized Pain quality: aching   Pain radiates to:  Does not radiate Pain severity:  Moderate Onset quality:  Gradual Duration:  1 week Timing:  Constant Progression:  Worsening Context: alcohol use (chronic prior with liver failure)   Relieved by:  Nothing Worsened by:  Nothing tried Ineffective treatments:  None tried Associated symptoms: shortness of breath   Associated symptoms: no fever, no hematemesis and no hematochezia   Risk factors comment:  Chronically ill, recent admission for SBP and anemia thought 2/2 GI bleed and signed out AMA before workup   Past Medical History  Diagnosis Date  . Cirrhosis of liver (HCC)   . Ascitic fluid   . Alcoholism (HCC)   . Anxiety   . History of stomach ulcers   . Insomnia    Past Surgical History  Procedure Laterality Date  . Appendectomy    . Hernia repair      inguinal hernia x 2   Family History  Problem Relation Age of Onset  . Colon cancer Neg Hx   . Liver disease Brother     Hep C-Blood Transfusion    Social History  Substance Use Topics  . Smoking status: Former Smoker -- 0.00 packs/day    Quit date: 12/26/2011  . Smokeless tobacco: Never Used  . Alcohol Use: No    Review of Systems  Constitutional: Negative for fever.  Respiratory: Positive for shortness of breath.   Gastrointestinal: Positive for abdominal pain. Negative for hematochezia and hematemesis.  All other systems reviewed and are negative.     Allergies  Nsaids  Home Medications   Prior to Admission medications   Medication Sig Start Date  End Date Taking? Authorizing Provider  ciprofloxacin (CIPRO) 500 MG tablet Take 1 tablet (500 mg total) by mouth 2 (two) times daily. 09/23/15   Calvert Cantor, MD  ferrous sulfate 325 (65 FE) MG tablet Take 1 tablet (325 mg total) by mouth 2 (two) times daily with a meal. 09/23/15   Calvert Cantor, MD  furosemide (LASIX) 40 MG tablet Take 3-4 tablets (120-160 mg total) by mouth 2 (two) times daily. Take 160mg  in the morning and 120mg  in the evening. 09/23/15   Calvert Cantor, MD  Multiple Vitamin (MULITIVITAMIN WITH MINERALS) TABS Take 1 tablet by mouth daily.    Historical Provider, MD  nadolol (CORGARD) 20 MG tablet Take 0.5 tablets (10 mg total) by mouth daily. 09/23/15   Calvert Cantor, MD  oxycodone (OXY-IR) 5 MG capsule Take 5 mg by mouth every 4 (four) hours as needed for pain.    Historical Provider, MD  pantoprazole (PROTONIX) 40 MG tablet Take 1 tablet (40 mg total) by mouth 2 (two) times daily. 09/24/15   Calvert Cantor, MD  potassium chloride SA (K-DUR,KLOR-CON) 20 MEQ tablet Take 60 mEq by mouth daily.     Historical Provider, MD  spironolactone (ALDACTONE) 100 MG tablet Take 2 tablets (200 mg total) by mouth 2 (two) times daily. 09/23/15   Calvert Cantor, MD  zolpidem (AMBIEN) 10 MG tablet Take 1 tablet (10 mg total) by mouth at bedtime. 03/03/13  Roxy Horseman, PA-C   BP 107/68 mmHg  Pulse 100  Temp(Src) 97.6 F (36.4 C) (Oral)  Resp 18  Wt 192 lb 3.2 oz (87.181 kg)  SpO2 99% Physical Exam  Constitutional: He is oriented to person, place, and time. He appears well-developed and well-nourished. He appears ill (chronically). No distress.  HENT:  Head: Normocephalic and atraumatic.  Eyes: Conjunctivae are normal.  Neck: Neck supple. No tracheal deviation present.  Cardiovascular: Normal rate, regular rhythm and normal heart sounds.   Pulmonary/Chest: Effort normal. No respiratory distress. He has rales (bibasilar).  Abdominal: He exhibits distension (marked, firm). There is tenderness  (diffuse. nonfocal). There is no rebound and no guarding.  Neurological: He is alert and oriented to person, place, and time.  Skin: Skin is warm and dry. There is pallor.  Psychiatric: His affect is angry and inappropriate.  Vitals reviewed.   ED Course  Procedures (including critical care time) Labs Review Labs Reviewed  LIPASE, BLOOD - Abnormal; Notable for the following:    Lipase 71 (*)    All other components within normal limits  COMPREHENSIVE METABOLIC PANEL - Abnormal; Notable for the following:    Sodium 127 (*)    Chloride 98 (*)    CO2 18 (*)    Glucose, Bld 158 (*)    Calcium 8.2 (*)    Total Protein 5.8 (*)    Albumin 2.5 (*)    Total Bilirubin 1.5 (*)    All other components within normal limits  CBC - Abnormal; Notable for the following:    WBC 11.6 (*)    RBC 3.04 (*)    Hemoglobin 7.6 (*)    HCT 25.7 (*)    MCH 25.0 (*)    MCHC 29.6 (*)    RDW 19.2 (*)    All other components within normal limits  PROTIME-INR - Abnormal; Notable for the following:    Prothrombin Time 18.5 (*)    INR 1.54 (*)    All other components within normal limits  POC OCCULT BLOOD, ED - Abnormal; Notable for the following:    Fecal Occult Bld POSITIVE (*)    All other components within normal limits  APTT    Imaging Review No results found. I have personally reviewed and evaluated these images and lab results as part of my medical decision-making.   EKG Interpretation None      MDM   Final diagnoses:  Alcoholic cirrhosis of liver with ascites (HCC)  Iron deficiency anemia due to chronic blood loss   55 y.o. very unfortunate male presents with increasing abdominal distention and pain in the last week after being admitted to the hospital for blood loss anemia and GI bleed with SBP and reaccumulation of ascitic fluid in setting of chronic liver failure for which he is being evaluated for transplant. He is a difficult historian secondary to chronic noncompliance and has  signed out AGAINST MEDICAL ADVICE multiple times. On today's exam he is in no acute distress and his vital signs appear stable, but his hemoglobin is persistently depressed at 7.6, his fecal occult test is positive suggesting ongoing GI bleed which may be sentinel in nature. He did not complete his previous evaluation and remains distended likely secondary to his ongoing liver failure issues. He is requesting drainage of his abdominal ascites but I recommended that he come in for inpatient admission to have that done as well as complete his workup for GI bleeding which he did not do previously and  he agreed to do this. Hospitalist was consulted for admission and will see the patient in the emergency department.     Lyndal Pulley, MD 10/14/15 [phone]

## 2015-10-14 NOTE — ED Notes (Signed)
Pt to be discharged per Junious Silk, PA, pt attempting to leave with IV in place, pt redirected back to his room & IV was removed, this RN reviewed discharge  Instructions with the pt, pt verbalizes understanding, pt states, "I am moving to [facility] this weekend permanently so I will have to follow up there." pt encouraged to keep all follow up appts

## 2015-10-14 NOTE — H&P (Signed)
[facility] History and Physical                                                                                    Clayton Watson, is a 55 y.o. male  MRN: [id number]   DOB - Jan 16, 1961  Admit Date - 10/14/2015  Outpatient Primary MD for the patient is BERG, Lynett Fish, MD  Referring MD: Tawny Hopping  Consulting M.D: Interventional Radiology  PMH: Past Medical History  Diagnosis Date  . Cirrhosis of liver (HCC)   . Ascitic fluid   . Alcoholism (HCC)   . Anxiety   . History of stomach ulcers   . Insomnia       PSH: Past Surgical History  Procedure Laterality Date  . Appendectomy    . Hernia repair      inguinal hernia x 2     CC:  Chief Complaint  Patient presents with  . Ascites     HPI: This is a 55 year old male patient known long-standing alcoholic cirrhosis, portal vein thrombosis, chronic recurrent anemia and thrombocytopenia and known esophageal varices with prior banding. His last upper endoscopy was on 12/13/14 was 6 bands placed on grade 2 varices. Last visit with the [facility] transplant team was on 06/23/15 and during that visit options regarding a relation by a second transplant surgeon to discuss hernia repair was completed as well as dietary management for minimizing additional ascites accumulation. No changes in medications were made and an MRI was apparently scheduled to evaluate a liver lesion seen on ultrasound in October 2016. In addition patient had requested to hold off until 2017 to undergo planned EGD and colonoscopy. Recommendations will return to clinic in 6 months sooner depending on MRI findings. In review of the [facility] records under care everywhere it does not appear patient ever followed up to have that MRI completed. This patient has been admitted to our facility 3 times in the past 12 months with the most recent being at the end of January. He presented because he desired paracentesis for enlarging ascites. The past 2 admissions patient left AGAINST  MEDICAL ADVICE. Note patient has had persistent heme positive stools without reports of frank melena or bright red blood per rectum or bright red emesis or dark emesis. He returns today requesting elective paracentesis and to be discharged immediately following procedure.  ER Evaluation and treatment: Temperature 97.6, BP 121/72, pulse 100, respirations 18, room air saturations 100%, weight 192 pounds Laboratory data: Sodium 127, CO2 18, calcium 8.2, BUN 14 and creatinine 0.7, glucose 158, albumin 2.5, lipase 71, total bilirubin 1.5, alkaline phosphatase 85. WBCs 11,600, hemoglobin 7.6, platelets 161,000, FOB positive  Review of Systems   In addition to the HPI above,  No Fever-chills, myalgias or other constitutional symptoms No Headache, changes with Vision or hearing, new weakness, tingling, numbness in any extremity, No problems swallowing food or Liquids, indigestion/reflux No Chest pain, Cough or Shortness of Breath, palpitations, orthopnea or DOE No Abdominal pain, N/V; no melena or hematochezia, no dark tarry stools, Bowel movements are regular, No dysuria, hematuria or flank pain No new skin rashes, lesions, masses or bruises, No new joints pains-aches No recent  weight gain or loss No polyuria, polydypsia or polyphagia,  *A full 10 point Review of Systems was done, except as stated above, all other Review of Systems were negative.  Social History Social History  Substance Use Topics  . Smoking status: Former Smoker -- 0.00 packs/day    Quit date: 12/26/2011  . Smokeless tobacco: Never Used  . Alcohol Use: No    Resides at: Private residence  Lives with: Alone and planning to move to [facility]  Ambulatory status: Without assistive devices   Family History Family History  Problem Relation Age of Onset  . Colon cancer Neg Hx   . Liver disease Brother     Hep C-Blood Transfusion      Prior to Admission medications   Medication Sig Start Date End Date Taking?  Authorizing Provider  ferrous sulfate 325 (65 FE) MG tablet Take 1 tablet (325 mg total) by mouth 2 (two) times daily with a meal. 09/23/15  Yes Calvert Cantor, MD  furosemide (LASIX) 40 MG tablet Take 3-4 tablets (120-160 mg total) by mouth 2 (two) times daily. Take  in the morning and  in the evening. 09/23/15  Yes Calvert Cantor, MD  Multiple Vitamin (MULITIVITAMIN WITH MINERALS) TABS Take 1 tablet by mouth daily.   Yes Historical Provider, MD  nadolol (CORGARD) 20 MG tablet Take 0.5 tablets (10 mg total) by mouth daily. 09/23/15  Yes Calvert Cantor, MD  oxycodone (OXY-IR) 5 MG capsule Take 5 mg by mouth every 4 (four) hours as needed for pain.   Yes Historical Provider, MD  potassium chloride SA (K-DUR,KLOR-CON) 20 MEQ tablet Take 60 mEq by mouth daily.    Yes Historical Provider, MD  spironolactone (ALDACTONE) 100 MG tablet Take 2 tablets (200 mg total) by mouth 2 (two) times daily. 09/23/15  Yes Calvert Cantor, MD  zolpidem (AMBIEN) 10 MG tablet Take 1 tablet (10 mg total) by mouth at bedtime. 03/03/13  Yes Roxy Horseman, PA-C  ciprofloxacin (CIPRO) 500 MG tablet Take 1 tablet (500 mg total) by mouth 2 (two) times daily. 09/23/15   Calvert Cantor, MD    Allergies  Allergen Reactions  . Nsaids     Bleeding due to ulcer     Physical Exam  Vitals  Blood pressure 112/70, pulse 99, temperature 97.6 F (36.4 C), temperature source Oral, resp. rate 18, weight 192 lb 3.2 oz (87.181 kg), SpO2 98 %.   General:  In no acute distress, appears chronically ill  Psych:  Normal somewhat animated affect, Denies Suicidal or Homicidal ideations, Awake Alert, Oriented X 3. Speech and thought patterns are clear and appropriate, no apparent short term memory deficits  Neuro:   No focal neurological deficits, CN II through XII intact, Strength 5/5 all 4 extremities, Sensation intact all 4 extremities.  ENT:  Ears and Eyes appear Normal, Conjunctivae clear, PER. Moist oral mucosa without erythema or  exudates.  Neck:  Supple, No lymphadenopathy appreciated  Respiratory:  Symmetrical chest wall movement, Good air movement bilaterally, CTAB. Room Air  Cardiac:  RRR, No Murmurs, no LE edema noted, no JVD, No carotid bruits, peripheral pulses palpable at 2+  Abdomen:  Positive bowel sounds, Soft, Non tender, markedly distended with massive ascites with visible fluid wave upon palpation, multiple spider veins across abdomen  Skin:  No Cyanosis but patient does have a yellowish grayish cast to his skin,Skin Rash or Bruise.  Extremities: Symmetrical without obvious trauma or injury,  no effusions.  Data Review  CBC  Recent Labs  Lab 10/14/15 0844  WBC 11.6*  HGB 7.6*  HCT 25.7*  PLT 161  MCV 84.5  MCH 25.0*  MCHC 29.6*  RDW 19.2*    Chemistries   Recent Labs Lab 10/14/15 0844  NA 127*  K 4.0  CL 98*  CO2 18*  GLUCOSE 158*  BUN 14  CREATININE 0.87  CALCIUM 8.2*  AST 27  ALT 20  ALKPHOS 85  BILITOT 1.5*    estimated creatinine clearance is 104.2 mL/min (by C-G formula based on Cr of 0.87).  No results for input(s): TSH, T4TOTAL, T3FREE, THYROIDAB in the last 72 hours.  Invalid input(s): FREET3  Coagulation profile  Recent Labs Lab 10/14/15 0832  INR 1.54*    No results for input(s): DDIMER in the last 72 hours.  Cardiac Enzymes No results for input(s): CKMB, TROPONINI, MYOGLOBIN in the last 168 hours.  Invalid input(s): CK  Invalid input(s): POCBNP  Urinalysis    Component Value Date/Time   COLORURINE YELLOW 09/17/2015 0250   APPEARANCEUR CLEAR 09/17/2015 0250   LABSPEC 1.016 09/17/2015 0250   PHURINE 5.5 09/17/2015 0250   GLUCOSEU NEGATIVE 09/17/2015 0250   HGBUR NEGATIVE 09/17/2015 0250   BILIRUBINUR NEGATIVE 09/17/2015 0250   KETONESUR NEGATIVE 09/17/2015 0250   PROTEINUR NEGATIVE 09/17/2015 0250   UROBILINOGEN 0.2 08/02/2014 2344   NITRITE NEGATIVE 09/17/2015 0250   LEUKOCYTESUR NEGATIVE 09/17/2015 0250    Imaging results:   Dg  Chest 2 View  09/20/2015  CLINICAL DATA:  Shortness of breath.  Liver failure.  Ascites. EXAM: CHEST  2 VIEW COMPARISON:  11/26/2011 FINDINGS: Shallow inspiration. Normal heart size and pulmonary vascularity. No focal airspace disease or consolidation in the lungs. No blunting of costophrenic angles. No pneumothorax. Mediastinal contours appear intact. Old right rib fractures. Calcified and tortuous aorta. IMPRESSION: No active cardiopulmonary disease. Electronically Signed   By: Burman Nieves M.D.   On: 09/20/2015 02:17   US Paracentesis  09/20/2015  INDICATION: Alcoholic cirrhosis with recurrent ascites. Request diagnostic and therapeutic paracentesis. EXAM: ULTRASOUND GUIDED LEFT LOWER QUADRANT PARACENTESIS MEDICATIONS: None. ANESTHESIA/SEDATION: Moderate Sedation Time:  None COMPLICATIONS: None immediate. PROCEDURE: Informed written consent was obtained from the patient after a discussion of the risks, benefits and alternatives to treatment. A timeout was performed prior to the initiation of the procedure. Initial ultrasound scanning demonstrates a large amount of ascites within the right lower abdominal quadrant. The right lower abdomen was prepped and draped in the usual sterile fashion. 1% lidocaine with epinephrine was used for local anesthesia. Under direct ultrasound guidance, a Safe-T-Centesis was introduced. An ultrasound image was saved for documentation purposes. The paracentesis was performed. The catheter was removed and a dressing was applied. The patient tolerated the procedure well without immediate post procedural complication. FINDINGS: A total of approximately 6.3 L of clear yellow fluid was removed. Samples were sent to the laboratory as requested by the clinical team. IMPRESSION: Successful ultrasound-guided paracentesis yielding 6.3 liters of peritoneal fluid. Read by: Brayton El PA-C Electronically Signed   By: Corlis Leak M.D.   On: 09/20/2015 14:18   US  Paracentesis  09/17/2015  INDICATION: Cirrhosis, recurrent ascites. Request is made for therapeutic paracentesis. EXAM: ULTRASOUND-GUIDED THERAPEUTIC PARACENTESIS COMPARISON:  Prior paracentesis on 09/04/2014 MEDICATIONS: None. COMPLICATIONS: None immediate TECHNIQUE: Informed written consent was obtained from the patient after a discussion of the risks, benefits and alternatives to treatment. A timeout was performed prior to the initiation of the procedure. Initial ultrasound scanning demonstrates a large amount of ascites within the  right lower abdominal quadrant. The right lower abdomen was prepped and draped in the usual sterile fashion. 1% lidocaine was used for local anesthesia. Under direct ultrasound guidance, a 19 gauge, 7-cm, Yueh catheter was introduced. An ultrasound image was saved for documentation purposed. The paracentesis was performed. The catheter was removed and a dressing was applied. The patient tolerated the procedure well without immediate post procedural complication. FINDINGS: A total of approximately 8.7 liters of hazy, light yellow fluid was removed. IMPRESSION: Successful ultrasound-guided therapeutic paracentesis yielding 8.7 liters of peritoneal fluid. Read by: Jeananne Rama, PA-C Electronically Signed   By: Irish Lack M.D.   On: 09/17/2015 10:51     Assessment & Plan  Principal Problem:   Alcoholic cirrhosis of liver with ascites  -Chronic recurrent problem despite regular use of diuretics -Patient has been admitted as observation but has told us that once his procedure is completed he plans to leave the ER and go home and wishes to be formally discharged -We will give 5% albumin 30 minutes prior to paracentesis -Have requested to have a minimum of 6-8 L removed -This is a therapeutic Paracentesis and no clinical signs of SBP so no indication to perform any labs/cytology on ascites fluid -Patient has informed us that he is planning on moving to [facility] and plans on establishing with the transplant center there as well as with a new primary care physician in [place]  Active Problems:   Anemia due to chronic blood loss -Last EGD was performed in April 2016 and 6 bands were up applied to grade 2 esophageal varices -Patient is denying any dark or red emesis or dark or red stools -Recommended to the patient that he undergo surveillance EGD preferably this admission but patient has refused stating he will follow up with physicians as stated above once he moves to [facility] (please note patient was to undergo follow-up EGD 2 months after April EGD but never scheduled and then at most recent visit with [facility] transplant team requested to defer EGD until 2017) -Patient's vital signs are stable and his hemoglobin is stable and near baseline so no acute indication to pursue EGD at this point    Thrombocytopenia  -Current platelets are actually higher than typical baseline -Recommend follow-up with hepatologist    Portal vein thrombosis -No upper abdominal pain noted on exam   DVT Prophylaxis: SCDs  Family Communication:   No family at bedside  Code Status: Full code   Condition: Stable    Discharge disposition: Anticipate patient will discharge to home after paracentesis completed   Discharge instructions: 1) continue all preadmission medications 2) follow-up with current PCP or once you relocate to [facility] please establish with PCP 3) follow-up with [facility] transplant team as previously recommended or when she relocate to [facility] please establish with [facility] hepatologist/transplant team 4) call or return to nearest emergency department if you develop fevers, abdominal pain, dark or red emesis or stools  Time spent in minutes : 60      ELLIS,ALLISON L. ANP on 10/14/2015 at 12:15 PM  You may contact me by going to [email or web address] - password [email or web address]  I am available from 7a-7p but please confirm I am on  the schedule by going to Amion as above.   After 7p please contact night coverage person covering me after hours  [facility]

## 2015-10-14 NOTE — Discharge Instructions (Signed)
Paracentesis °Paracentesis is a procedure to remove excess fluid (ascites) from the belly (abdomen). Ascites can result from certain conditions, such as infection, inflammation, abdominal injury, heart failure, chronic scarring of the liver (cirrhosis), or cancer. Ascites is removed using a needle that is inserted through the skin and tissue into the abdomen. °This procedure may be done: °· To determine the cause of the ascites. °· To relieve symptoms that are caused by the ascites, such as pain or shortness of breath. °· To see if there is bleeding after an abdominal injury. °LET YOUR HEALTH CARE PROVIDER KNOW ABOUT: °· Any allergies you have. °· All medicines you are taking, including vitamins, herbs, eye drops, creams, and over-the-counter medicines. °· Previous problems you or members of your family have had with the use of anesthetics. °· Any blood disorders you have. °· Previous surgeries you have had. °· Any medical conditions you have. °· Whether you are pregnant or may be pregnant. °RISKS AND COMPLICATIONS °Generally, this is a safe procedure. However, problems may occur, including: °· Infection. °· Bleeding. °· Injury to an abdominal organ, such as the bowel (large intestine), liver, spleen, or bladder. °· Low blood pressure (hypotension). °· Spreading of cancer, if there are cancer cells in the abdominal fluid. °· Mental status changes in people who have liver disease. These changes would be caused by shifts in the balance of fluids and minerals (electrolytes) in the body. °BEFORE THE PROCEDURE °· Ask your health care provider about: °¨ Changing or stopping your regular medicines. This is especially important if you are taking diabetes medicines or blood thinners. °¨ Taking medicines such as aspirin and ibuprofen. These medicines can thin your blood. Do not take these medicines before your procedure if your health care provider instructs you not to. °· A blood sample may be done to determine your blood  clotting time. °· You will be asked to urinate. °PROCEDURE °· You may be asked to lie on your back with your head raised (elevated). °· To reduce your risk of infection: °¨ Your health care team will wash or sanitize their hands. °¨ Your skin will be washed with soap. °· You will be given a medicine to numb the area (local anesthetic). °· Your abdominal skin will be punctured with a needle or a scalpel. °· A drainage tube will be inserted through the puncture site. Fluid will drain through the tube into a container. °· After enough fluid has been removed, the tube will be removed. °· A sample of the fluid will be sent for examination. °· A bandage (dressing) will be placed over the puncture site. °The procedure may vary among health care providers and hospitals. °AFTER THE PROCEDURE °· It is your responsibility to get your test results. Ask your health care provider or the department performing the test when your results will be ready. °  °This information is not intended to replace advice given to you by your health care provider. Make sure you discuss any questions you have with your health care provider. °  °Document Released: 02/26/2005 Document Revised: 05/04/2015 Document Reviewed: 10/26/2014 °Elsevier Interactive Patient Education ©2016 [employer] ° °

## 2015-10-14 NOTE — Procedures (Signed)
Ultrasound-guided  therapeutic paracentesis performed yielding 8 liters (maximum ordered) of turbid, light yellow fluid. No immediate complications.

## 2015-10-14 NOTE — ED Notes (Signed)
Patient can not get an urine sample at this time will try later 

## 2016-03-27 DEATH — deceased

## 2016-11-13 IMAGING — US US PARACENTESIS
1 series · 4 of 4 positions shown · non-contrast
Comparison: none

INDICATION: Cirrhosis, recurrent ascites. Request is made for therapeutic
paracentesis up to 8 liters.

[Series 1: us paracentesis · 0.25mm/px · 4 of 4 slices shown]
[im 1/4]
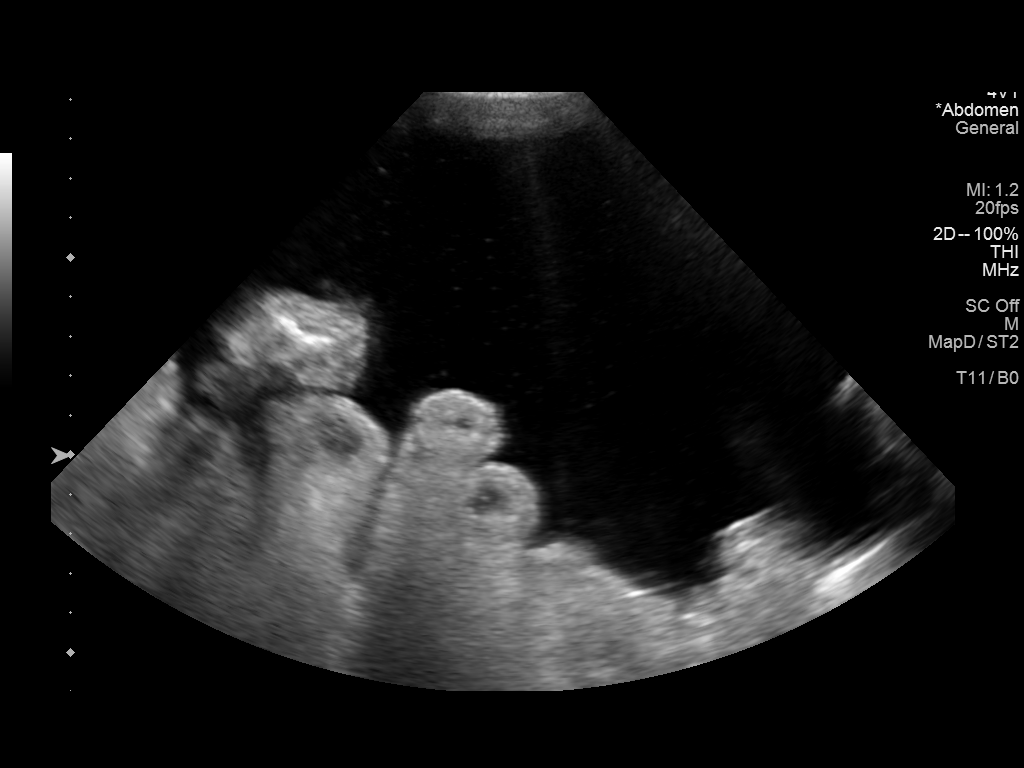
[im 2/4]
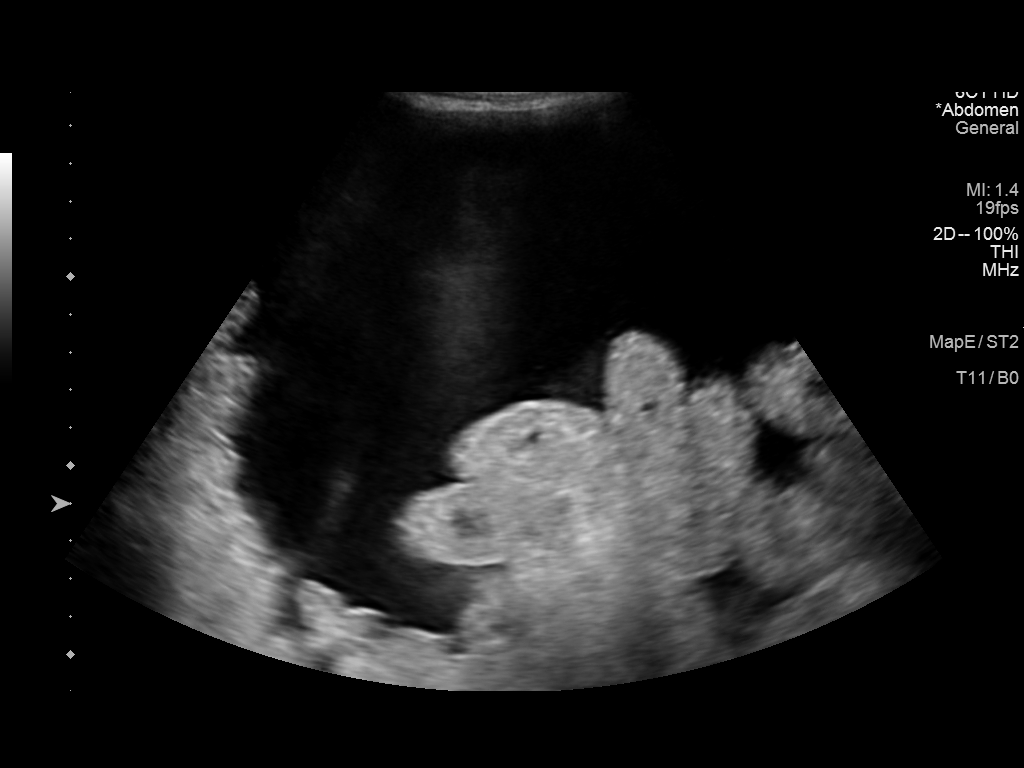
[im 3/4]
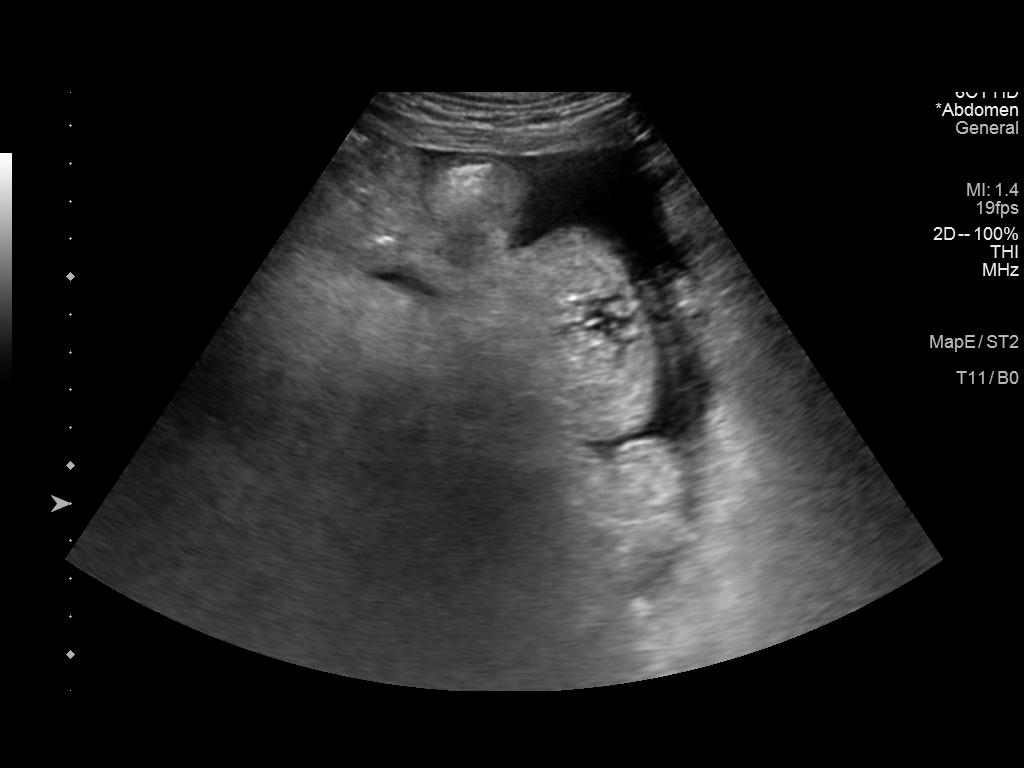
[im 4/4]
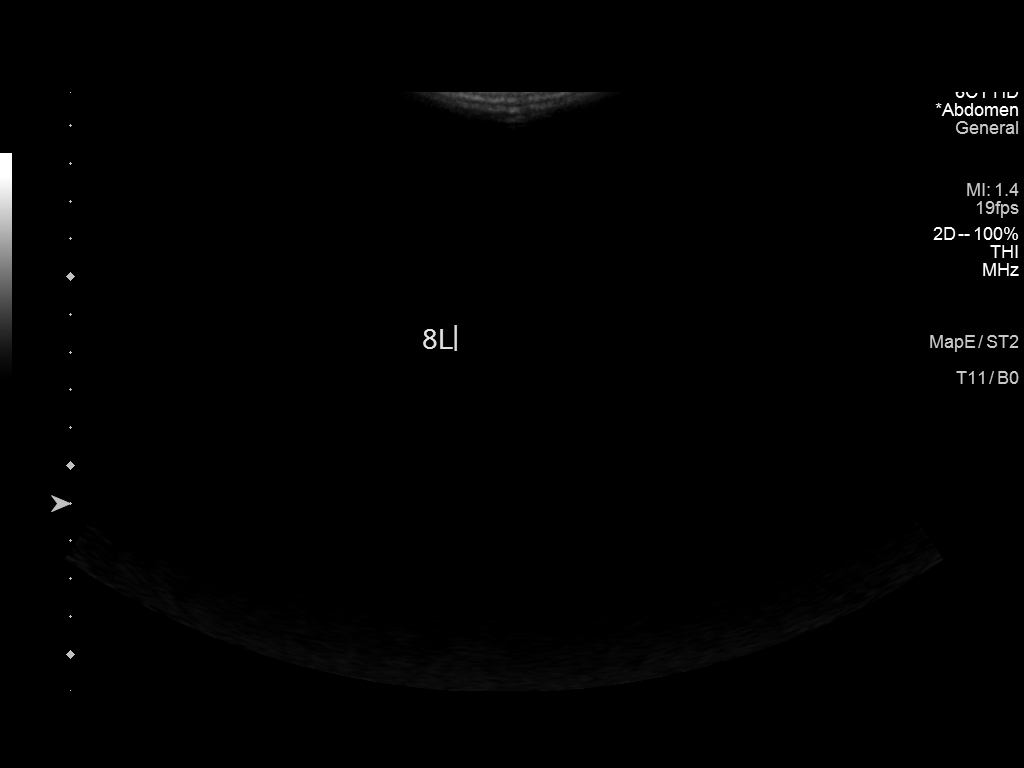

[4 of 4 positions shown; findings below may reference images not displayed]

EXAM:
ULTRASOUND GUIDED THERAPEUTIC PARACENTESIS

MEDICATIONS:
None.

COMPLICATIONS:
None immediate.

PROCEDURE:
Informed written consent was obtained from the patient after a
discussion of the risks, benefits and alternatives to treatment. A
timeout was performed prior to the initiation of the procedure.

Initial ultrasound scanning demonstrates a large amount of ascites
within the left lower abdominal quadrant. The left lower abdomen was
prepped and draped in the usual sterile fashion. 1% lidocaine was
used for local anesthesia.

Following this, a Yueh catheter was introduced. An ultrasound image
was saved for documentation purposes. The paracentesis was
performed. The catheter was removed and a dressing was applied. The
patient tolerated the procedure well without immediate post
procedural complication.
FINDINGS: A total of approximately 8 liters of turbid, light yellow fluid was
removed.
IMPRESSION: Successful ultrasound-guided therapeutic paracentesis yielding 8
liters liters of peritoneal fluid.
